# Patient Record
Sex: Male | Born: 1990 | Race: Black or African American | Hispanic: No | Smoking: Never smoker
Health system: Southern US, Community
[De-identification: ages and names within clinical notes are randomized; demographics above are authoritative.]

## PROBLEM LIST (undated history)

## (undated) DIAGNOSIS — E785 Hyperlipidemia, unspecified: Secondary | ICD-10-CM

## (undated) DIAGNOSIS — R7303 Prediabetes: Secondary | ICD-10-CM

## (undated) HISTORY — PX: NO PAST SURGERIES: SHX2092

---

## 2016-07-25 ENCOUNTER — Ambulatory Visit (HOSPITAL_COMMUNITY)
Admission: EM | Admit: 2016-07-25 | Discharge: 2016-07-25 | Disposition: A | Discharge status: Home / Self Care | Payer: BC Managed Care – PPO | Attending: Family Medicine | Admitting: Family Medicine

## 2016-07-25 ENCOUNTER — Encounter (HOSPITAL_COMMUNITY): Payer: Self-pay | Admitting: Emergency Medicine

## 2016-07-25 DIAGNOSIS — J0110 Acute frontal sinusitis, unspecified: Secondary | ICD-10-CM

## 2016-07-25 MED ORDER — DOXYCYCLINE HYCLATE 100 MG PO CAPS: 100.0000 mg | ORAL_CAPSULE | Freq: Two times a day (BID) | ORAL | 0 refills | Status: DC

## 2016-07-25 MED ORDER — IPRATROPIUM BROMIDE 0.06 % NA SOLN: 2.0000 | Freq: Four times a day (QID) | NASAL | 1 refills | Status: DC

## 2016-07-25 MED ORDER — GUAIFENESIN-CODEINE 100-10 MG/5ML PO SYRP: 10.0000 mL | ORAL_SOLUTION | Freq: Four times a day (QID) | ORAL | 0 refills | Status: DC | PRN

## 2016-07-25 NOTE — Discharge Instructions (Signed)
Drink plenty of fluids as discussed, humidifier shower and use medicine as prescribed, and mucinex or delsym for cough. Return or see your doctor if further problems

## 2016-07-25 NOTE — ED Provider Notes (Signed)
[facility]    CSN: [id number] Arrival date & time: 07/25/16  [id number]     History   Chief Complaint Chief Complaint  Patient presents with  . Facial Pain  . Cough    HPI Micheal Gross is a 25 y.o. male.   The history is provided by the patient and a parent.  Cough  Cough characteristics:  Non-productive Severity:  Moderate Onset quality:  Sudden Duration:  1 week Progression:  Unchanged Chronicity:  New Smoker: no   Context: upper respiratory infection and weather changes   Relieved by:  None tried Worsened by:  Nothing Ineffective treatments:  None tried Associated symptoms: ear fullness, rhinorrhea, sinus congestion and sore throat   Associated symptoms: no fever, no shortness of breath and no wheezing     History reviewed. No pertinent past medical history.  There are no active problems to display for this patient.   History reviewed. No pertinent surgical history.     Home Medications    Prior to Admission medications   Not on File    Family History History reviewed. No pertinent family history.  Social History Social History  Substance Use Topics  . Smoking status: Never Smoker  . Smokeless tobacco: Never Used  . Alcohol use Yes     Comment: occasional     Allergies   Patient has no known allergies.   Review of Systems Review of Systems  Constitutional: Negative.  Negative for fever.  HENT: Positive for congestion, postnasal drip, rhinorrhea and sore throat.   Respiratory: Positive for cough. Negative for shortness of breath and wheezing.   Cardiovascular: Negative.   Genitourinary: Negative.   All other systems reviewed and are negative.    Physical Exam Triage Vital Signs ED Triage Vitals [07/25/16 1854]  Enc Vitals Group     BP 149/79     Pulse Rate 105     Resp      Temp 98.8 F (37.1 C)     Temp Source Oral     SpO2 97 %     Weight      Height      Head Circumference      Peak Flow      Pain Score  5     Pain Loc      Pain Edu?      Excl. in GC?    No data found.   Updated Vital Signs BP 149/79 (BP Location: Right Wrist)   Pulse 105   Temp 98.8 F (37.1 C) (Oral)   SpO2 97%   Visual Acuity Right Eye Distance:   Left Eye Distance:   Bilateral Distance:    Right Eye Near:   Left Eye Near:    Bilateral Near:     Physical Exam  Constitutional: He is oriented to person, place, and time. He appears well-developed and well-nourished. No distress.  HENT:  Right Ear: External ear normal.  Left Ear: External ear normal.  Nose: Mucosal edema and rhinorrhea present. Right sinus exhibits frontal sinus tenderness. Left sinus exhibits frontal sinus tenderness.  Mouth/Throat: Oropharynx is clear and moist.  Eyes: Conjunctivae and EOM are normal. Pupils are equal, round, and reactive to light.  Neck: Normal range of motion. Neck supple.  Cardiovascular: Normal rate, regular rhythm, normal heart sounds and intact distal pulses.   Pulmonary/Chest: Effort normal and breath sounds normal.  Lymphadenopathy:    He has no cervical adenopathy.  Neurological: He is alert and oriented to person, place,  and time.  Skin: Skin is warm and dry.  Nursing note and vitals reviewed.    UC Treatments / Results  Labs (all labs ordered are listed, but only abnormal results are displayed) Labs Reviewed - No data to display  EKG  EKG Interpretation None       Radiology No results found.  Procedures Procedures (including critical care time)  Medications Ordered in UC Medications - No data to display   Initial Impression / Assessment and Plan / UC Course  I have reviewed the triage vital signs and the nursing notes.  Pertinent labs & imaging results that were available during my care of the patient were reviewed by me and considered in my medical decision making (see chart for details).  Clinical Course       Final Clinical Impressions(s) / UC Diagnoses   Final diagnoses:    None    New Prescriptions New Prescriptions   No medications on file     Linna HoffJames D Rielyn Krupinski, MD 07/25/16 2107

## 2016-07-25 NOTE — ED Triage Notes (Signed)
Pt has been suffering from congestion, sinus pressure, fatigue and weakness, sore throat and fever for over one week.  He is here today for the persistent cough and facial pain.

## 2016-08-04 ENCOUNTER — Ambulatory Visit (INDEPENDENT_AMBULATORY_CARE_PROVIDER_SITE_OTHER): Payer: BC Managed Care – PPO | Admitting: Physician Assistant

## 2016-08-04 ENCOUNTER — Ambulatory Visit (INDEPENDENT_AMBULATORY_CARE_PROVIDER_SITE_OTHER): Payer: BC Managed Care – PPO

## 2016-08-04 VITALS — BP 116/72 | HR 82 | Temp 98.9°F | Resp 18 | Ht 71.0 in | Wt 303.0 lb

## 2016-08-04 DIAGNOSIS — J069 Acute upper respiratory infection, unspecified: Secondary | ICD-10-CM

## 2016-08-04 DIAGNOSIS — R05 Cough: Secondary | ICD-10-CM

## 2016-08-04 DIAGNOSIS — J189 Pneumonia, unspecified organism: Secondary | ICD-10-CM | POA: Diagnosis not present

## 2016-08-04 DIAGNOSIS — R059 Cough, unspecified: Secondary | ICD-10-CM

## 2016-08-04 DIAGNOSIS — Z131 Encounter for screening for diabetes mellitus: Secondary | ICD-10-CM | POA: Diagnosis not present

## 2016-08-04 LAB — POCT CBC
GRANULOCYTE PERCENT: 76.3 % (ref 37–80)
HEMATOCRIT: 45.8 % (ref 43.5–53.7)
Hemoglobin: 15.8 g/dL (ref 14.1–18.1)
Lymph, poc: 2.4 (ref 0.6–3.4)
MCH, POC: 28.5 pg (ref 27–31.2)
MCHC: 34.5 g/dL (ref 31.8–35.4)
MCV: 82.6 fL (ref 80–97)
MID (CBC): 0.5 (ref 0–0.9)
MPV: 8.5 fL (ref 0–99.8)
PLATELET COUNT, POC: 349 10*3/uL (ref 142–424)
POC GRANULOCYTE: 9.5 — AB (ref 2–6.9)
POC LYMPH %: 19.5 % (ref 10–50)
POC MID %: 4.2 %M (ref 0–12)
RBC: 5.54 M/uL (ref 4.69–6.13)
RDW, POC: 14.5 %
WBC: 12.5 10*3/uL — AB (ref 4.6–10.2)

## 2016-08-04 LAB — POCT GLYCOSYLATED HEMOGLOBIN (HGB A1C): HEMOGLOBIN A1C: 5.7

## 2016-08-04 MED ORDER — AZITHROMYCIN 250 MG PO TABS: ORAL_TABLET | ORAL | 0 refills | Status: DC

## 2016-08-04 NOTE — Patient Instructions (Addendum)
Drink lots of water. Come back in 4-5 days if you are not improving.     IF you received an x-ray today, you will receive an invoice from [facility]. Please contact [facility] at [phone] with questions or concerns regarding your invoice.   IF you received labwork today, you will receive an invoice from [facility]. Please contact [facility] at [phone] with questions or concerns regarding your invoice.   Our billing staff will not be able to assist you with questions regarding bills from these companies.  You will be contacted with the lab results as soon as they are available. The fastest way to get your results is to activate your My Chart account. Instructions are located on the last page of this paperwork. If you have not heard from us regarding the results in 2 weeks, please contact this office.

## 2016-08-04 NOTE — Progress Notes (Addendum)
08/04/2016 1:04 PM   DOB: 20-Sep-1990 / MRN: [id number]  SUBJECTIVE:  Micheal Gross is a 26 y.o. male presenting for HA, fatigue, cough that has been present since the 29th of December.  Was evaluated for a URI on the 29th however was ill from the 20th to now.he denies a history of asthma, and is a never smoker. Has taken taken 7 days of doxycycline and report this did not help his symptoms.. Denies fever, chills, nausea.   He has No Known Allergies.   He  has no past medical history on file.    He  reports that he has never smoked. He has never used smokeless tobacco. He reports that he drinks alcohol. He reports that he does not use drugs. He  has no sexual activity history on file. The patient  has no past surgical history on file.  His family history is not on file.  Review of Systems  Constitutional: Negative for chills and fever.  Genitourinary: Negative for dysuria.  Skin: Negative for itching and rash.    The problem list and medications were reviewed and updated by myself where necessary and exist elsewhere in the encounter.   OBJECTIVE:  BP 116/72 (BP Location: Right Arm, Patient Position: Sitting, Cuff Size: Large)   Pulse 82   Temp 98.9 F (37.2 C) (Oral)   Resp 18   Ht 5\' 11"  (1.803 m)   Wt (!) 303 lb (137.4 kg)   SpO2 98%   BMI 42.26 kg/m   Physical Exam  Constitutional: He is oriented to person, place, and time. He appears well-developed. He does not appear ill.  Eyes: Conjunctivae and EOM are normal. Pupils are equal, round, and reactive to light.  Cardiovascular: Normal rate and regular rhythm.   Pulmonary/Chest: Effort normal and breath sounds normal. No respiratory distress. He has no wheezes. He has no rales. He exhibits no tenderness.  Abdominal: He exhibits no distension.  Musculoskeletal: Normal range of motion.  Neurological: He is alert and oriented to person, place, and time. No cranial nerve deficit. Coordination normal.  Skin: Skin is warm  and dry. He is not diaphoretic.  Psychiatric: He has a normal mood and affect.  Nursing note and vitals reviewed.   Results for orders placed or performed in visit on 08/04/16 (from the past 72 hour(s))  POCT CBC     Status: Abnormal   Collection Time: 08/04/16  1:00 PM  Result Value Ref Range   WBC 12.5 (A) 4.6 - 10.2 K/uL   Lymph, poc 2.4 0.6 - 3.4   POC LYMPH PERCENT 19.5 10 - 50 %L   MID (cbc) 0.5 0 - 0.9   POC MID % 4.2 0 - 12 %M   POC Granulocyte 9.5 (A) 2 - 6.9   Granulocyte percent 76.3 37 - 80 %G   RBC 5.54 4.69 - 6.13 M/uL   Hemoglobin 15.8 14.1 - 18.1 g/dL   HCT, POC 52.8 41.3 - 53.7 %   MCV 82.6 80 - 97 fL   MCH, POC 28.5 27 - 31.2 pg   MCHC 34.5 31.8 - 35.4 g/dL   RDW, POC 24.4 %   Platelet Count, POC 349 142 - 424 K/uL   MPV 8.5 0 - 99.8 fL  POCT glycosylated hemoglobin (Hb A1C)     Status: None   Collection Time: 08/04/16  1:02 PM  Result Value Ref Range   Hemoglobin A1C 5.7     Dg Chest 2 View  Result Date: 08/04/2016  CLINICAL DATA:  Initial evaluation for acute cough of 3 weeks. EXAM: CHEST  2 VIEW COMPARISON:  None available. FINDINGS: Cardiac and mediastinal silhouettes are within normal limits. Lungs are mildly hypoinflated with elevation left hemidiaphragm. No focal infiltrates identified. No pulmonary edema or pleural effusion. No pneumothorax. Visualized soft tissues and osseous structures within normal limits. IMPRESSION: 1. No active cardiopulmonary disease identified. 2. Mild elevation of the left hemidiaphragm. Electronically Signed   By: Rise MuBenjamin  McClintock M.D.   On: 08/04/2016 12:56    ASSESSMENT AND PLAN:  Micheal Gross was seen today for headache, dizziness and fatigue.  Diagnoses and all orders for this visit:  Cough: Rads and chest exam clear.  He has a granulocytosis and given that it has been so long since these symptoms have started I think it is reasonable to try a different abx.  Will start zpack.  -     DG Chest 2 View; Future -      POCT CBC  Protracted URI -     POCT glycosylated hemoglobin (Hb A1C)  Atypical pneumonia: See problem one.   Other orders -     azithromycin (ZITHROMAX) 250 MG tablet; Take two on day one and one daily thereafter.    The patient is advised to call or return to clinic if he does not see an improvement in symptoms, or to seek the care of the closest emergency department if he worsens with the above plan.   Deliah BostonMichael Arturo Sofranko, MHS, PA-C [facility] 08/04/2016 1:04 PM

## 2017-03-16 ENCOUNTER — Ambulatory Visit (INDEPENDENT_AMBULATORY_CARE_PROVIDER_SITE_OTHER): Payer: BC Managed Care – PPO | Admitting: Urgent Care

## 2017-03-16 ENCOUNTER — Encounter: Payer: Self-pay | Admitting: Urgent Care

## 2017-03-16 VITALS — BP 133/72 | HR 70 | Temp 98.3°F | Resp 18 | Ht 71.0 in | Wt 297.8 lb

## 2017-03-16 DIAGNOSIS — R42 Dizziness and giddiness: Secondary | ICD-10-CM

## 2017-03-16 DIAGNOSIS — R7303 Prediabetes: Secondary | ICD-10-CM | POA: Diagnosis not present

## 2017-03-16 DIAGNOSIS — R5383 Other fatigue: Secondary | ICD-10-CM

## 2017-03-16 DIAGNOSIS — R519 Headache, unspecified: Secondary | ICD-10-CM

## 2017-03-16 DIAGNOSIS — R51 Headache: Secondary | ICD-10-CM

## 2017-03-16 LAB — POCT CBC
GRANULOCYTE PERCENT: 68 % (ref 37–80)
HCT, POC: 46.8 % (ref 43.5–53.7)
HEMOGLOBIN: 14.5 g/dL (ref 14.1–18.1)
Lymph, poc: 2.2 (ref 0.6–3.4)
MCH, POC: 25.9 pg — AB (ref 27–31.2)
MCHC: 31.1 g/dL — AB (ref 31.8–35.4)
MCV: 83.4 fL (ref 80–97)
MID (cbc): 0.4 (ref 0–0.9)
MPV: 8.5 fL (ref 0–99.8)
PLATELET COUNT, POC: 327 10*3/uL (ref 142–424)
POC Granulocyte: 5.6 (ref 2–6.9)
POC LYMPH %: 26.9 % (ref 10–50)
POC MID %: 5.1 %M (ref 0–12)
RBC: 5.61 M/uL (ref 4.69–6.13)
RDW, POC: 15.4 %
WBC: 8.3 10*3/uL (ref 4.6–10.2)

## 2017-03-16 LAB — POCT GLYCOSYLATED HEMOGLOBIN (HGB A1C): Hemoglobin A1C: 5.8

## 2017-03-16 NOTE — Progress Notes (Signed)
MRN: [id number] DOB: 23-May-1991  Subjective:   Micheal Gross is a 26 y.o. male presenting for chief complaint of Headache (x2 weeks; states after drinking tea he has felt bad); Fatigue; and Dizziness (as well as car sickness(motion))  Reports 2 week history of intermittent headaches, fatigue, dizziness, polydipsia, feels choking sensation in his throat, sinus pressure, some weight loss. His headaches are generalized, all over, lasts several hours, do not occur daily. Has not tried any medications for relief. Denies fever, confusion, sinus pain, throat pain, cough, chest pain, shob, n/v, abdominal pain, dysuria, hematuria, urinary frequency, bloody stools, dark tarry stools. Drinks once every 3-4 weeks. Denies smoking cigarettes. Does exercise normally except for the past 2 weeks when he has felt fatigued. 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 Eats unhealthily, hydrates well.   Van is not currently taking any medications. Also has No Known Allergies.  Ephrem denies past medical and surgical history. Denies family history of cancer, diabetes, HTN, HL, heart disease, stroke, mental illness.    Objective:   Vitals: BP 133/72   Pulse 70   Temp 98.3 F (36.8 C) (Oral)   Resp 18   Ht 5\' 11"  (1.803 m)   Wt 297 lb 12.8 oz (135.1 kg)   SpO2 96%   BMI 41.53 kg/m   Physical Exam  Constitutional: He is oriented to person, place, and time. He appears well-developed and well-nourished.  HENT:  Mouth/Throat: Oropharynx is clear and moist.  Eyes: No scleral icterus.  Neck: Normal range of motion. Neck supple. No thyromegaly present.  Cardiovascular: Normal rate, regular rhythm and intact distal pulses.  Exam reveals no gallop and no friction rub.   No murmur heard. Pulmonary/Chest: No respiratory distress. He has no wheezes. He has no rales.  Abdominal: Soft. Bowel sounds are normal. He exhibits no distension and no mass. There is no tenderness. There is no guarding.  Neurological: He  is alert and oriented to person, place, and time.  Skin: Skin is warm and dry.  Psychiatric: He has a normal mood and affect.   Results for orders placed or performed in visit on 03/16/17 (from the past 24 hour(s))  POCT CBC     Status: Abnormal   Collection Time: 03/16/17 10:33 AM  Result Value Ref Range   WBC 8.3 4.6 - 10.2 K/uL   Lymph, poc 2.2 0.6 - 3.4   POC LYMPH PERCENT 26.9 10 - 50 %L   MID (cbc) 0.4 0 - 0.9   POC MID % 5.1 0 - 12 %M   POC Granulocyte 5.6 2 - 6.9   Granulocyte percent 68.0 37 - 80 %G   RBC 5.61 4.69 - 6.13 M/uL   Hemoglobin 14.5 14.1 - 18.1 g/dL   HCT, POC 73.5 67.0 - 53.7 %   MCV 83.4 80 - 97 fL   MCH, POC 25.9 (A) 27 - 31.2 pg   MCHC 31.1 (A) 31.8 - 35.4 g/dL   RDW, POC 14.1 %   Platelet Count, POC 327 142 - 424 K/uL   MPV 8.5 0 - 99.8 fL  POCT glycosylated hemoglobin (Hb A1C)     Status: None   Collection Time: 03/16/17 10:37 AM  Result Value Ref Range   Hemoglobin A1C 5.8    Assessment and Plan :   1. Dizziness 2. Other fatigue 3. Nonintractable headache, unspecified chronicity pattern, unspecified headache type - Unclear etiology, physical exam findings reassuring, labs pending. Will f/u with results.  4. Pre-diabetes - Recommended lifestyle modifications.  Wallis Bamberg, PA-C Primary Care at [facility] [phone] 03/16/2017  9:51 AM

## 2017-03-16 NOTE — Patient Instructions (Addendum)
Salads - kale, spinach, cabbage, spring mix; use seeds like pumpkin seeds or sunflower seeds; you can also use 1-2 hard boiled eggs. Fruits - avocadoes, berries (blueberries, raspberries, blackberries), apples, oranges, pomegranate, grapefruit Seeds - quinoa, chia seeds; you can also incorporate oatmeal Vegetables - aspargus, cauliflower, broccoli, green beans, brussel spouts, bell peppers; stay away from starchy vegetables like potatoes, carrots, peas  Brussel sprouts - Cut off stems. Place in a mixing bowl that has a lid. Pour in a 1/4-1/2 cup olive oil, spices, use a light amount of parmesan. Place on a baking sheet. Bake for 10 minutes at 400F. Take it out, eat the brussel chips. Place for another 5-10 minutes.   Mashed cauliflower - Boil a bunch of cauliflower in a pot of water. Blend in a food processor with 1-2 tablespoons of butter.  Spaghetti squash -  Cut the squash in half very carefully, clean out seeds from the middle. Place 1/2 face down in a microwave safe dish with at least 2 inches of water. Make 4-6 slits on outside of spaghetti squash and microwave for 10-12 minutes. Take out the spaghetti using a metal spoon. Repeat for the other half.   Vega protein is good protein powder, make sure you use ~6 ice cubes to give it smoothie consistency together with ~4-6 ounces of vanilla soy milk. Throw cinnamon into your shake, use peanut butter. You can also use the fruits that I listed above. Throw spinach or kale into the shake.     Fatigue Fatigue is feeling tired all of the time, a lack of energy, or a lack of motivation. Occasional or mild fatigue is often a normal response to activity or life in general. However, long-lasting (chronic) or extreme fatigue may indicate an underlying medical condition. Follow these instructions at home: Watch your fatigue for any changes. The following actions may help to lessen any discomfort you are feeling:  Talk to your health care provider about  how much sleep you need each night. Try to get the required amount every night.  Take medicines only as directed by your health care provider.  Eat a healthy and nutritious diet. Ask your health care provider if you need help changing your diet.  Drink enough fluid to keep your urine clear or pale yellow.  Practice ways of relaxing, such as yoga, meditation, massage therapy, or acupuncture.  Exercise regularly.  Change situations that cause you stress. Try to keep your work and personal routine reasonable.  Do not abuse illegal drugs.  Limit alcohol intake to no more than 1 drink per day for nonpregnant women and 2 drinks per day for men. One drink equals 12 ounces of beer, 5 ounces of wine, or 1 ounces of hard liquor.  Take a multivitamin, if directed by your health care provider.  Contact a health care provider if:  Your fatigue does not get better.  You have a fever.  You have unintentional weight loss or gain.  You have headaches.  You have difficulty: ? Falling asleep. ? Sleeping throughout the night.  You feel angry, guilty, anxious, or sad.  You are unable to have a bowel movement (constipation).  You skin is dry.  Your legs or another part of your body is swollen. Get help right away if:  You feel confused.  Your vision is blurry.  You feel faint or pass out.  You have a severe headache.  You have severe abdominal, pelvic, or back pain.  You have chest pain, shortness  of breath, or an irregular or fast heartbeat.  You are unable to urinate or you urinate less than normal.  You develop abnormal bleeding, such as bleeding from the rectum, vagina, nose, lungs, or nipples.  You vomit blood.  You have thoughts about harming yourself or committing suicide.  You are worried that you might harm someone else. This information is not intended to replace advice given to you by your health care provider. Make sure you discuss any questions you have with  your health care provider. Document Released: 05/11/2007 Document Revised: 12/20/2015 Document Reviewed: 11/15/2013 Elsevier Interactive Patient Education  2018 [employer].   IF you received an x-ray today, you will receive an invoice from [facility]. Please contact [facility] at [phone] with questions or concerns regarding your invoice.   IF you received labwork today, you will receive an invoice from [facility]. Please contact [facility] at [phone] with questions or concerns regarding your invoice.   Our billing staff will not be able to assist you with questions regarding bills from these companies.  You will be contacted with the lab results as soon as they are available. The fastest way to get your results is to activate your My Chart account. Instructions are located on the last page of this paperwork. If you have not heard from [place] regarding the results in 2 weeks, please contact this office.

## 2017-03-17 LAB — COMPREHENSIVE METABOLIC PANEL
ALK PHOS: 73 IU/L (ref 39–117)
ALT: 13 IU/L (ref 0–44)
AST: 18 IU/L (ref 0–40)
Albumin/Globulin Ratio: 1.5 (ref 1.2–2.2)
Albumin: 4.4 g/dL (ref 3.5–5.5)
BUN/Creatinine Ratio: 9 (ref 9–20)
BUN: 12 mg/dL (ref 6–20)
Bilirubin Total: 0.6 mg/dL (ref 0.0–1.2)
CO2: 24 mmol/L (ref 20–29)
CREATININE: 1.28 mg/dL — AB (ref 0.76–1.27)
Calcium: 9.9 mg/dL (ref 8.7–10.2)
Chloride: 103 mmol/L (ref 96–106)
GFR calc Af Amer: 88 mL/min/{1.73_m2} (ref >59)
GFR calc non Af Amer: 76 mL/min/{1.73_m2} (ref >59)
GLUCOSE: 96 mg/dL (ref 65–99)
Globulin, Total: 3 g/dL (ref 1.5–4.5)
Potassium: 4.6 mmol/L (ref 3.5–5.2)
Sodium: 142 mmol/L (ref 134–144)
Total Protein: 7.4 g/dL (ref 6.0–8.5)

## 2017-03-17 LAB — TSH: TSH: 0.997 u[IU]/mL (ref 0.450–4.500)

## 2017-09-22 ENCOUNTER — Encounter: Payer: Self-pay | Admitting: Urgent Care

## 2017-09-22 ENCOUNTER — Ambulatory Visit: Payer: BC Managed Care – PPO | Admitting: Urgent Care

## 2017-09-22 VITALS — BP 124/81 | HR 92 | Temp 98.3°F | Resp 18 | Ht 71.0 in | Wt 298.6 lb

## 2017-09-22 DIAGNOSIS — R7303 Prediabetes: Secondary | ICD-10-CM

## 2017-09-22 DIAGNOSIS — R1084 Generalized abdominal pain: Secondary | ICD-10-CM | POA: Diagnosis not present

## 2017-09-22 LAB — POCT CBC
Granulocyte percent: 66.3 %G (ref 37–80)
HEMATOCRIT: 45.4 % (ref 43.5–53.7)
Hemoglobin: 15.4 g/dL (ref 14.1–18.1)
LYMPH, POC: 2.7 (ref 0.6–3.4)
MCH, POC: 28 pg (ref 27–31.2)
MCHC: 33.9 g/dL (ref 31.8–35.4)
MCV: 82.5 fL (ref 80–97)
MID (cbc): 0.4 (ref 0–0.9)
MPV: 8.4 fL (ref 0–99.8)
POC GRANULOCYTE: 6.2 (ref 2–6.9)
POC LYMPH %: 29.1 % (ref 10–50)
POC MID %: 4.6 % (ref 0–12)
Platelet Count, POC: 322 10*3/uL (ref 142–424)
RBC: 5.5 M/uL (ref 4.69–6.13)
RDW, POC: 14.3 %
WBC: 9.3 10*3/uL (ref 4.6–10.2)

## 2017-09-22 LAB — POCT GLYCOSYLATED HEMOGLOBIN (HGB A1C): Hemoglobin A1C: 5.8

## 2017-09-22 MED ORDER — RANITIDINE HCL 150 MG PO TABS: 150.0000 mg | ORAL_TABLET | Freq: Two times a day (BID) | ORAL | 0 refills | Status: DC

## 2017-09-22 MED ORDER — OMEPRAZOLE 20 MG PO CPDR: 20.0000 mg | DELAYED_RELEASE_CAPSULE | Freq: Every day | ORAL | 3 refills | Status: DC

## 2017-09-22 NOTE — Progress Notes (Signed)
  MRN: [id number] DOB: [birth year]  Subjective:   Micheal Gross is a 27 y.o. male presenting for 4 day history of belly pain. Pain is intermittent, burning type sensation, has had difficulty tolerating his normal foods, 2 loose stools today. He is trying to hydrate, drink water. Reports that he has also had thirst. Denies fever, decreased appetite, n/v, diarrhea, bloody stools. Admits unhealthy diet with fatty, greasy or fried foods. Denies smoking cigarettes.  Micheal Gross is not currently taking any medications and has No Known Allergies.  Micheal Gross denies past medical and surgical history. Denies family history of diabetes.   Objective:   Vitals: BP 124/81   Pulse 92   Temp 98.3 F (36.8 C) (Oral)   Resp 18   Ht 5\' 11"  (1.803 m)   Wt 298 lb 9.6 oz (135.4 kg)   SpO2 97%   BMI 41.65 kg/m   Physical Exam  Constitutional: He is oriented to person, place, and time. He appears well-developed and well-nourished.  Cardiovascular: Normal rate, regular rhythm and intact distal pulses. Exam reveals no gallop and no friction rub.  No murmur heard. Pulmonary/Chest: No respiratory distress. He has no wheezes. He has no rales.  Abdominal: Soft. Bowel sounds are normal. He exhibits no distension and no mass. There is generalized tenderness. There is no rebound and no guarding.  Neurological: He is alert and oriented to person, place, and time.  Skin: Skin is warm and dry.  Psychiatric: He has a normal mood and affect.   Results for orders placed or performed in visit on 09/22/17 (from the past 24 hour(s))  POCT CBC     Status: None   Collection Time: 09/22/17  3:26 PM  Result Value Ref Range   WBC 9.3 4.6 - 10.2 K/uL   Lymph, poc 2.7 0.6 - 3.4   POC LYMPH PERCENT 29.1 10 - 50 %L   MID (cbc) 0.4 0 - 0.9   POC MID % 4.6 0 - 12 %M   POC Granulocyte 6.2 2 - 6.9   Granulocyte percent 66.3 37 - 80 %G   RBC 5.50 4.69 - 6.13 M/uL   Hemoglobin 15.4 14.1 - 18.1 g/dL   HCT, POC 40.945.4 81.143.5 -  53.7 %   MCV 82.5 80 - 97 fL   MCH, POC 28.0 27 - 31.2 pg   MCHC 33.9 31.8 - 35.4 g/dL   RDW, POC 91.414.3 %   Platelet Count, POC 322 142 - 424 K/uL   MPV 8.4 0 - 99.8 fL  POCT glycosylated hemoglobin (Hb A1C)     Status: None   Collection Time: 09/22/17  3:33 PM  Result Value Ref Range   Hemoglobin A1C 5.8     Assessment and Plan :   Generalized abdominal pain - Plan: Comprehensive metabolic panel, POCT CBC, H. pylori breath test, POCT glycosylated hemoglobin (Hb A1C)  Morbid obesity (HCC)  Will manage as GERD, labs pending. Recommended dietary modifications. Will discuss treatment plan and follow up once labs result.   Wallis BambergMario Kathlynn Swofford, PA-C [facility] at [facility] [phone] 09/22/2017  3:11 PM

## 2017-09-22 NOTE — Patient Instructions (Addendum)
Start taking Zantac for short term relief of what I suspect is acid reflux. This medication is to be taken twice daily and stops working after about 4-6 weeks. It will be important to use this medication while Prilosec get into your system and helps you more long term. This kind of medication takes about 2-3 weeks to really start working. In the meantime, review the information below regarding GERD. I will follow up with your lab results and discuss treatment plan with you when everything comes back.   Abdominal Pain, Adult Many things can cause belly (abdominal) pain. Most times, belly pain is not dangerous. Many cases of belly pain can be watched and treated at home. Sometimes belly pain is serious, though. Your doctor will try to find the cause of your belly pain. Follow these instructions at home:  Take over-the-counter and prescription medicines only as told by your doctor. Do not take medicines that help you poop (laxatives) unless told to by your doctor.  Drink enough fluid to keep your pee (urine) clear or pale yellow.  Watch your belly pain for any changes.  Keep all follow-up visits as told by your doctor. This is important. Contact a doctor if:  Your belly pain changes or gets worse.  You are not hungry, or you lose weight without trying.  You are having trouble pooping (constipated) or have watery poop (diarrhea) for more than 2-3 days.  You have pain when you pee or poop.  Your belly pain wakes you up at night.  Your pain gets worse with meals, after eating, or with certain foods.  You are throwing up and cannot keep anything down.  You have a fever. Get help right away if:  Your pain does not go away as soon as your doctor says it should.  You cannot stop throwing up.  Your pain is only in areas of your belly, such as the right side or the left lower part of the belly.  You have bloody or black poop, or poop that looks like tar.  You have very bad pain,  cramping, or bloating in your belly.  You have signs of not having enough fluid or water in your body (dehydration), such as: ? Dark pee, very little pee, or no pee. ? Cracked lips. ? Dry mouth. ? Sunken eyes. ? Sleepiness. ? Weakness. This information is not intended to replace advice given to you by your health care provider. Make sure you discuss any questions you have with your health care provider. Document Released: 12/31/2007 Document Revised: 02/01/2016 Document Reviewed: 12/26/2015 Elsevier Interactive Patient Education  2018 [employer]     IF you received an x-ray today, you will receive an invoice from [facility]. Please contact [facility] at [phone] with questions or concerns regarding your invoice.   IF you received labwork today, you will receive an invoice from [facility]. Please contact [facility] at [phone] with questions or concerns regarding your invoice.   Our billing staff will not be able to assist you with questions regarding bills from these companies.  You will be contacted with the lab results as soon as they are available. The fastest way to get your results is to activate your My Chart account. Instructions are located on the last page of this paperwork. If you have not heard from us regarding the results in 2 weeks, please contact this office.

## 2017-09-23 LAB — COMPREHENSIVE METABOLIC PANEL
ALK PHOS: 79 IU/L (ref 39–117)
ALT: 15 IU/L (ref 0–44)
AST: 15 IU/L (ref 0–40)
Albumin/Globulin Ratio: 1.5 (ref 1.2–2.2)
Albumin: 4.8 g/dL (ref 3.5–5.5)
BUN/Creatinine Ratio: 8 — ABNORMAL LOW (ref 9–20)
BUN: 10 mg/dL (ref 6–20)
Bilirubin Total: 0.6 mg/dL (ref 0.0–1.2)
CALCIUM: 10 mg/dL (ref 8.7–10.2)
CO2: 23 mmol/L (ref 20–29)
CREATININE: 1.2 mg/dL (ref 0.76–1.27)
Chloride: 100 mmol/L (ref 96–106)
GFR calc Af Amer: 95 mL/min/{1.73_m2} (ref >59)
GFR, EST NON AFRICAN AMERICAN: 82 mL/min/{1.73_m2} (ref >59)
GLOBULIN, TOTAL: 3.1 g/dL (ref 1.5–4.5)
GLUCOSE: 84 mg/dL (ref 65–99)
Potassium: 4.8 mmol/L (ref 3.5–5.2)
Sodium: 140 mmol/L (ref 134–144)
Total Protein: 7.9 g/dL (ref 6.0–8.5)

## 2017-09-23 LAB — H. PYLORI BREATH TEST: H PYLORI BREATH TEST: NEGATIVE

## 2018-01-21 ENCOUNTER — Ambulatory Visit: Payer: BC Managed Care – PPO | Admitting: Urgent Care

## 2018-01-22 ENCOUNTER — Ambulatory Visit: Payer: BC Managed Care – PPO | Admitting: Urgent Care

## 2018-01-22 ENCOUNTER — Other Ambulatory Visit: Payer: Self-pay

## 2018-01-22 ENCOUNTER — Encounter: Payer: Self-pay | Admitting: Urgent Care

## 2018-01-22 ENCOUNTER — Ambulatory Visit: Payer: BC Managed Care – PPO | Admitting: Physician Assistant

## 2018-01-22 VITALS — BP 130/83 | HR 69 | Temp 98.7°F | Resp 18 | Ht 71.0 in | Wt 294.6 lb

## 2018-01-22 DIAGNOSIS — S76019A Strain of muscle, fascia and tendon of unspecified hip, initial encounter: Secondary | ICD-10-CM

## 2018-01-22 DIAGNOSIS — M62838 Other muscle spasm: Secondary | ICD-10-CM | POA: Diagnosis not present

## 2018-01-22 DIAGNOSIS — M7918 Myalgia, other site: Secondary | ICD-10-CM

## 2018-01-22 MED ORDER — MELOXICAM 15 MG PO TABS: 7.5000 mg | ORAL_TABLET | Freq: Every day | ORAL | 0 refills | Status: DC

## 2018-01-22 MED ORDER — CYCLOBENZAPRINE HCL 5 MG PO TABS: 5.0000 mg | ORAL_TABLET | Freq: Every day | ORAL | 1 refills | Status: DC

## 2018-01-22 NOTE — Patient Instructions (Addendum)
Muscle Cramps and Spasms Muscle cramps and spasms occur when a muscle or muscles tighten and you have no control over this tightening (involuntary muscle contraction). They are a common problem and can develop in any muscle. The most common place is in the calf muscles of the leg. Muscle cramps and muscle spasms are both involuntary muscle contractions, but there are some differences between the two:  Muscle cramps are painful. They come and go and may last a few seconds to 15 minutes. Muscle cramps are often more forceful and last longer than muscle spasms.  Muscle spasms may or may not be painful. They may also last just a few seconds or much longer.  Certain medical conditions, such as diabetes or Parkinson disease, can make it more likely to develop cramps or spasms. However, cramps or spasms are usually not caused by a serious underlying problem. Common causes include:  Overexertion.  Overuse from repetitive motions, or doing the same thing over and over.  Remaining in a certain position for a long period of time.  Improper preparation, form, or technique while playing a sport or doing an activity.  Dehydration.  Injury.  Side effects of some medicines.  Abnormally low levels of the salts and ions in your blood (electrolytes), especially potassium and calcium. This could happen if you are taking water pills (diuretics) or if you are pregnant.  In many cases, the cause of muscle cramps or spasms is unknown. Follow these instructions at home:  Stay well hydrated. Drink enough fluid to keep your urine clear or pale yellow.  Try massaging, stretching, and relaxing the affected muscle.  If directed, apply heat to tight or tense muscles as often as told by your health care provider. Use the heat source that your health care provider recommends, such as a moist heat pack or a heating pad. ? Place a towel between your skin and the heat source. ? Leave the heat on for 20-30  minutes. ? Remove the heat if your skin turns bright red. This is especially important if you are unable to feel pain, heat, or cold. You may have a greater risk of getting burned.  If directed, put ice on the affected area. This may help if you are sore or have pain after a cramp or spasm. ? Put ice in a plastic bag. ? Place a towel between your skin and the bag. ? Leavethe ice on for 20 minutes, 2-3 times a day.  Take over-the-counter and prescription medicines only as told by your health care provider.  Pay attention to any changes in your symptoms. Contact a health care provider if:  Your cramps or spasms get more severe or happen more often.  Your cramps or spasms do not improve over time. This information is not intended to replace advice given to you by your health care provider. Make sure you discuss any questions you have with your health care provider. Document Released: 01/03/2002 Document Revised: 08/15/2015 Document Reviewed: 04/17/2015 Elsevier Interactive Patient Education  2018 Elsevier Inc.     IF you received an x-ray today, you will receive an invoice from [facility]. Please contact [facility] at [phone] with questions or concerns regarding your invoice.   IF you received labwork today, you will receive an invoice from [facility]. Please contact [facility] at [phone] with questions or concerns regarding your invoice.   Our billing staff will not be able to assist you with questions regarding bills from these companies.  You will be contacted   with the lab results as soon as they are available. The fastest way to get your results is to activate your My Chart account. Instructions are located on the last page of this paperwork. If you have not heard from us regarding the results in 2 weeks, please contact this office.      

## 2018-01-22 NOTE — Progress Notes (Signed)
    MRN: [id number] DOB: May 18, 1991  Subjective:   Micheal Gross is a 27 y.o. male presenting for 3-day history of right buttock pain.  Patient reports that the buttock pain is like a burning type sensation and is more on the underside of his buttock closer to his thigh and medially.  Reports that Gross has been exercising for the past 27 out of 30 days.  Gross is doing a combination of weightlifting including squats, dead lifts and running on the treadmill, using start climber.  Gross cannot call any inciting event.  Gross has tried ibuprofen with some relief.  Since Gross started having pain Gross has not been exercising.  Denies history of sciatica, piriformis syndrome.  Denies dysuria, hematuria, weakness, constipation, bloody stools, testicular pain, penile pain, low back pain.  Micheal Gross Gross is not currently taking any medications and has no known food or drug allergies.  Denies past medical and surgical history.  Objective:   Vitals: BP 130/83   Pulse 69   Temp 98.7 F (37.1 C) (Oral)   Resp 18   Ht 5\' 11"  (1.803 m)   Wt 294 lb 9.6 oz (133.6 kg)   SpO2 96%   BMI 41.09 kg/m   Physical Exam  Constitutional: Gross is oriented to person, place, and time. Gross appears well-developed and well-nourished.  Cardiovascular: Normal rate.  Pulmonary/Chest: Effort normal.  Musculoskeletal:       Right hip: Gross exhibits tenderness (over area depicted). Gross exhibits normal range of motion, normal strength, no bony tenderness, no swelling, no crepitus, no deformity and no laceration.       Lumbar back: Gross exhibits normal range of motion, no tenderness, no bony tenderness, no swelling, no edema, no deformity, no laceration, no pain, no spasm and normal pulse.       Legs: Positive straight leg raise to the right.  Neurological: Gross is alert and oriented to person, place, and time.   Assessment and Plan :   Strain of buttock, initial encounter  Buttock pain  Muscle spasm  Will start conservative  management for his symptoms including rest, meloxicam, Flexeril and consistent hydration.  Reviewed stretching exercises for low back and buttock.  Patient is to follow-up in 1 to 2 weeks if his symptoms persist.  Micheal BambergMario Coleen Cardiff, PA-C Primary Care at [facility] [phone] 01/22/2018  1:48 PM

## 2018-01-26 ENCOUNTER — Encounter: Payer: Self-pay | Admitting: Urgent Care

## 2018-01-27 ENCOUNTER — Encounter: Payer: Self-pay | Admitting: Physician Assistant

## 2018-01-27 ENCOUNTER — Ambulatory Visit: Payer: BC Managed Care – PPO | Admitting: Urgent Care

## 2018-01-27 ENCOUNTER — Other Ambulatory Visit: Payer: Self-pay

## 2018-01-27 ENCOUNTER — Telehealth: Payer: Self-pay | Admitting: Physician Assistant

## 2018-01-27 ENCOUNTER — Ambulatory Visit: Payer: BC Managed Care – PPO | Admitting: Physician Assistant

## 2018-01-27 VITALS — BP 110/74 | HR 87 | Temp 98.0°F | Resp 16 | Ht 72.0 in | Wt 293.0 lb

## 2018-01-27 DIAGNOSIS — K649 Unspecified hemorrhoids: Secondary | ICD-10-CM

## 2018-01-27 MED ORDER — HYDROCORTISONE 2.5 % RE CREA: 1.0000 "application " | TOPICAL_CREAM | Freq: Two times a day (BID) | RECTAL | 0 refills | Status: DC

## 2018-01-27 MED ORDER — HYDROCORTISONE ACETATE 25 MG RE SUPP: 25.0000 mg | Freq: Two times a day (BID) | RECTAL | 0 refills | Status: DC

## 2018-01-27 NOTE — Telephone Encounter (Signed)
Pt is asking for prescription medication for pain - he just left office and "forgot to ask".  States "The llidocaine is wearing off."  If appropriate:  [id number] - [place], [place] - [address]    Phone: [phone] Fax: [phone]

## 2018-01-27 NOTE — Patient Instructions (Addendum)
Do sitz baths 1-2 times a day for the next week.  This will help facilitate drainage.  You can use topical steroid and/or suppositories for this, but long-term use should be avoided because of potential thinning of perianal and anal mucosa and increasing risk of injury.  Be mindful of your weight lifting techniques.   Hemorrhoids Hemorrhoids are swollen veins in and around the rectum or anus. There are two types of hemorrhoids:  Internal hemorrhoids. These occur in the veins that are just inside the rectum. They may poke through to the outside and become irritated and painful.  External hemorrhoids. These occur in the veins that are outside of the anus and can be felt as a painful swelling or hard lump near the anus.  Most hemorrhoids do not cause serious problems, and they can be managed with home treatments such as diet and lifestyle changes. If home treatments do not help your symptoms, procedures can be done to shrink or remove the hemorrhoids. What are the causes? This condition is caused by increased pressure in the anal area. This pressure may result from various things, including:  Constipation.  Straining to have a bowel movement.  Diarrhea.  Pregnancy.  Obesity.  Sitting for long periods of time.  Heavy lifting or other activity that causes you to strain.  Anal sex.  What are the signs or symptoms? Symptoms of this condition include:  Pain.  Anal itching or irritation.  Rectal bleeding.  Leakage of stool (feces).  Anal swelling.  One or more lumps around the anus.  How is this diagnosed? This condition can often be diagnosed through a visual exam. Other exams or tests may also be done, such as:  Examination of the rectal area with a gloved hand (digital rectal exam).  Examination of the anal canal using a small tube (anoscope).  A blood test, if you have lost a significant amount of blood.  A test to look inside the colon (sigmoidoscopy or  colonoscopy).  How is this treated? This condition can usually be treated at home. However, various procedures may be done if dietary changes, lifestyle changes, and other home treatments do not help your symptoms. These procedures can help make the hemorrhoids smaller or remove them completely. Some of these procedures involve surgery, and others do not. Common procedures include:  Rubber band ligation. Rubber bands are placed at the base of the hemorrhoids to cut off the blood supply to them.  Sclerotherapy. Medicine is injected into the hemorrhoids to shrink them.  Infrared coagulation. A type of light energy is used to get rid of the hemorrhoids.  Hemorrhoidectomy surgery. The hemorrhoids are surgically removed, and the veins that supply them are tied off.  Stapled hemorrhoidopexy surgery. A circular stapling device is used to remove the hemorrhoids and use staples to cut off the blood supply to them.  Follow these instructions at home: Eating and drinking  Eat foods that have a lot of fiber in them, such as whole grains, beans, nuts, fruits, and vegetables. Ask your health care provider about taking products that have added fiber (fiber supplements).  Drink enough fluid to keep your urine clear or pale yellow. Managing pain and swelling  Take warm sitz baths for 20 minutes, 3-4 times a day to ease pain and discomfort.  If directed, apply ice to the affected area. Using ice packs between sitz baths may be helpful. ? Put ice in a plastic bag. ? Place a towel between your skin and the  bag. ? Leave the ice on for 20 minutes, 2-3 times a day. General instructions  Take over-the-counter and prescription medicines only as told by your health care provider.  Use medicated creams or suppositories as told.  Exercise regularly.  Go to the bathroom when you have the urge to have a bowel movement. Do not wait.  Avoid straining to have bowel movements.  Keep the anal area dry and  clean. Use wet toilet paper or moist towelettes after a bowel movement.  Do not sit on the toilet for long periods of time. This increases blood pooling and pain. Contact a health care provider if:  You have increasing pain and swelling that are not controlled by treatment or medicine.  You have uncontrolled bleeding.  You have difficulty having a bowel movement, or you are unable to have a bowel movement.  You have pain or inflammation outside the area of the hemorrhoids. This information is not intended to replace advice given to you by your health care provider. Make sure you discuss any questions you have with your health care provider. Document Released: 07/11/2000 Document Revised: 12/12/2015 Document Reviewed: 03/28/2015 Elsevier Interactive Patient Education  2018 [employer].  IF you received an x-ray today, you will receive an invoice from [facility]. Please contact [facility] at [phone] with questions or concerns regarding your invoice.   IF you received labwork today, you will receive an invoice from [facility]. Please contact [facility] at [phone] with questions or concerns regarding your invoice.   Our billing staff will not be able to assist you with questions regarding bills from these companies.  You will be contacted with the lab results as soon as they are available. The fastest way to get your results is to activate your My Chart account. Instructions are located on the last page of this paperwork. If you have not heard from [place] regarding the results in 2 weeks, please contact this office.

## 2018-01-27 NOTE — Telephone Encounter (Signed)
Copied from CRM [id number]. Topic: Quick Communication - Rx Refill/Question >> Jan 27, 2018 12:11 PM Cipriano BunkerLambe, Annette S wrote: Medication:   Pt is asking if can get prescription for pain - he just left office and forgot to ask.  The lidocaine is wearing off.  Has the patient contacted their pharmacy? No. (Agent: If no, request that the patient contact the pharmacy for the refill.) (Agent: If yes, when and what did the pharmacy advise?)  Preferred Pharmacy (with phone number or street name):  [id number] - [place], [place] - [address] [facility] [id number] [place] [id number] Phone: [phone] Fax: [phone]    Agent: Please be advised that RX refills may take up to 3 business days. We ask that you follow-up with your pharmacy.

## 2018-01-27 NOTE — Progress Notes (Signed)
   Micheal FabryChristopher Gross  MRN: [id number] DOB: 14-Mar-1991  PCP: Patient, No Pcp Per  Subjective:  Pt is a 27 year old male who presents to clinic for hemorrhoid.  Patient has had a long history of hemorrhoids, dealing with them for several years. Pt has been treating with Witch Hazel, Peroxide, warm soaks was helping.  He has never had an I&D done. He has been dealing with his most recent flareup for the past 4 days.  It was "bad" 4 days ago then pain improved.  However pain returned yesterday and he endorses bright red blood in the toilet bowl yesterday.  Has been treating with sitz bath's with no improvement.  Pain is worse with sitting. He does a lot of weight lifting.  No straining with bowel movements.   Review of Systems  Constitutional: Negative for chills and fever.  Gastrointestinal: Positive for anal bleeding and rectal pain. Negative for blood in stool, constipation and diarrhea.    Patient Active Problem List   Diagnosis Date Noted  . Generalized abdominal pain 09/22/2017    Current Outpatient Medications on File Prior to Visit  Medication Sig Dispense Refill  . cyclobenzaprine (FLEXERIL) 5 MG tablet Take 1 tablet (5 mg total) by mouth at bedtime. 90 tablet 1  . meloxicam (MOBIC) 15 MG tablet Take 0.5-1 tablets (7.5-15 mg total) by mouth daily. 30 tablet 0   No current facility-administered medications on file prior to visit.     No Known Allergies   Objective:  BP 110/74 (BP Location: Left Arm, Patient Position: Sitting, Cuff Size: Large)   Pulse 87   Temp 98 F (36.7 C) (Oral)   Resp 16   Ht 6' (1.829 m)   Wt 293 lb (132.9 kg)   SpO2 97%   BMI 39.74 kg/m   Physical Exam  Constitutional: He is oriented to person, place, and time. He appears well-developed and well-nourished.  Genitourinary: Rectal exam shows external hemorrhoid (thrombosed ).  Neurological: He is alert and oriented to person, place, and time.  Skin: Skin is warm and dry.  Psychiatric: He  has a normal mood and affect. His behavior is normal. Judgment and thought content normal.  Vitals reviewed.  Procedure: Verbal consent obtained. Skin was anesthetized with lidocaine and epinephrine. Wound was open. A moderate amount of thrombosed material expressed. ound was not packed.. Wound care discussed.  Assessment and Plan :  1. Hemorrhoids, unspecified hemorrhoid type -Patient presents for thrombosed hemorrhoid.  Small to moderate amount of thrombosed clots removed from hemorrhoid.  Patient expresses 80% relief of his pain.  Sitz bath's advised.  Discussed with patient this is more than likely caused by his weight lifting.  Precautions discussed.  He can use Anusol in the future if needed.  Return to clinic if symptoms return - hydrocortisone (ANUSOL-HC) 25 MG suppository; Place 1 suppository (25 mg total) rectally 2 (two) times daily.  Dispense: 12 suppository; Refill: 0 - hydrocortisone (ANUSOL-HC) 2.5 % rectal cream; Place 1 application rectally 2 (two) times daily.  Dispense: 30 g; Refill: 0    Whitney Loras Grieshop, PA-C  Primary Care at [facility] 01/27/2018 10:28 AM

## 2018-01-30 ENCOUNTER — Other Ambulatory Visit: Payer: Self-pay | Admitting: Physician Assistant

## 2018-01-30 DIAGNOSIS — G8918 Other acute postprocedural pain: Secondary | ICD-10-CM

## 2018-01-30 MED ORDER — TRAMADOL HCL 50 MG PO TABS: 50.0000 mg | ORAL_TABLET | Freq: Three times a day (TID) | ORAL | 0 refills | Status: DC | PRN

## 2018-01-30 NOTE — Telephone Encounter (Signed)
Tramadol Rx sent 

## 2018-02-16 ENCOUNTER — Other Ambulatory Visit: Payer: Self-pay | Admitting: Urgent Care

## 2018-04-19 ENCOUNTER — Encounter: Payer: Self-pay | Admitting: Urgent Care

## 2018-04-21 ENCOUNTER — Ambulatory Visit: Payer: BC Managed Care – PPO | Admitting: Family Medicine

## 2018-04-28 ENCOUNTER — Other Ambulatory Visit: Payer: Self-pay

## 2018-04-28 ENCOUNTER — Ambulatory Visit: Payer: BC Managed Care – PPO | Admitting: Family Medicine

## 2018-04-28 ENCOUNTER — Encounter: Payer: Self-pay | Admitting: Family Medicine

## 2018-04-28 VITALS — BP 122/81 | HR 76 | Temp 98.5°F | Resp 16 | Ht 72.0 in | Wt 296.0 lb

## 2018-04-28 DIAGNOSIS — R058 Other specified cough: Secondary | ICD-10-CM

## 2018-04-28 DIAGNOSIS — H6121 Impacted cerumen, right ear: Secondary | ICD-10-CM | POA: Diagnosis not present

## 2018-04-28 DIAGNOSIS — R05 Cough: Secondary | ICD-10-CM | POA: Diagnosis not present

## 2018-04-28 MED ORDER — BENZONATATE 100 MG PO CAPS: 100.0000 mg | ORAL_CAPSULE | Freq: Three times a day (TID) | ORAL | 0 refills | Status: DC | PRN

## 2018-04-28 MED ORDER — AZITHROMYCIN 250 MG PO TABS: ORAL_TABLET | ORAL | 0 refills | Status: DC

## 2018-04-28 MED ORDER — PREDNISONE 20 MG PO TABS: ORAL_TABLET | ORAL | 0 refills | Status: DC

## 2018-04-28 NOTE — Progress Notes (Signed)
Patient ID: Micheal Gross, male    DOB: 12-Feb-1991  Age: 27 y.o. MRN: [id number]  Chief Complaint  Patient presents with  . Cough    chronic cough x 1 month with some chest congestion     Subjective:   27 year old man with a history of a cough for 1 month.  It started as a cold infection with congestion sore throat that cleared quickly, but the cough is continued to persist.  His girlfriend now has a mild case.  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  He does not do a lot of regular exercise.  Generally is healthy.  Realizes his weight is a problem.  Current allergies, medications, problem list, past/family and social histories reviewed.  Objective:  BP 122/81   Pulse 76   Temp 98.5 F (36.9 C) (Oral)   Resp 16   Ht 6' (1.829 m)   Wt 296 lb (134.3 kg)   SpO2 98%   BMI 40.14 kg/m   No major acute distress.  Overweight male.  TMs occluded with cerumen on the right.  The left is normal.  Throat clear.  Neck supple without nodes.  Chest is clear to auscultation except for mildly prolonged expiratory phase on forced expiration.  Heart regular without murmur.  Assessment & Plan:   Assessment: 1. Post-viral cough syndrome   2. Impacted cerumen of right ear       Plan: See instructions  No orders of the defined types were placed in this encounter.   Meds ordered this encounter  Medications  . benzonatate (TESSALON) 100 MG capsule    Sig: Take 1-2 capsules (100-200 mg total) by mouth 3 (three) times daily as needed for cough.    Dispense:  40 capsule    Refill:  0  . predniSONE (DELTASONE) 20 MG tablet    Sig: Take 3 pills each morning for 2 days, then 2 for 2 days, then 1 for 2 days for inflammation in lungs and sinuses.    Dispense:  12 tablet    Refill:  0  . azithromycin (ZITHROMAX) 250 MG tablet    Sig: Take 2 tabs PO x 1 dose, then 1 tab PO QD x 4 days    Dispense:  6 tablet    Refill:  0         Patient  Instructions   Drink lots of fluids including water and juice primarily.  Take the prednisone 20 mg 3 pills each morning for 2 days, then 2 for 2 days, then 1 for 2 days after breakfast.  Use the benzonatate cough pills 1 or 2 pills 3 times daily as needed for daytime cough  Take a azithromycin 250 mg 2 pills initially, then 1 daily for 4 days for possible residual infection  You can continue using NyQuil or Robitussin-DM in addition to that if necessary for nighttime cough.  Return if not improving.  I do not think you need an x-ray today but if problems continue to persist you would need further work-up.  Your right ear is full of wax.  I recommend getting some over-the-counter Debrox and follow the directions on it.  Use it several times until you get a little plug of wax out of there.  Return if it gives you problems.  If you have lab work done today you will be contacted with your lab results within the next 2 weeks.  If you have not heard  from [place] then please contact us. The fastest way to get your results is to register for My Chart.   IF you received an x-ray today, you will receive an invoice from [facility]. Please contact [facility] at [phone] with questions or concerns regarding your invoice.   IF you received labwork today, you will receive an invoice from [facility]. Please contact [facility] at [phone] with questions or concerns regarding your invoice.   Our billing staff will not be able to assist you with questions regarding bills from these companies.  You will be contacted with the lab results as soon as they are available. The fastest way to get your results is to activate your My Chart account. Instructions are located on the last page of this paperwork. If you have not heard from [place] regarding the results in 2 weeks, please contact this office.         Return if symptoms worsen or fail to improve.   Janace Hoard, MD 04/28/2018

## 2018-04-28 NOTE — Patient Instructions (Addendum)
Drink lots of fluids including water and juice primarily.  Take the prednisone 20 mg 3 pills each morning for 2 days, then 2 for 2 days, then 1 for 2 days after breakfast.  Use the benzonatate cough pills 1 or 2 pills 3 times daily as needed for daytime cough  Take a azithromycin 250 mg 2 pills initially, then 1 daily for 4 days for possible residual infection  You can continue using NyQuil or Robitussin-DM in addition to that if necessary for nighttime cough.  Return if not improving.  I do not think you need an x-ray today but if problems continue to persist you would need further work-up.  Your right ear is full of wax.  I recommend getting some over-the-counter Debrox and follow the directions on it.  Use it several times until you get a little plug of wax out of there.  Return if it gives you problems.  If you have lab work done today you will be contacted with your lab results within the next 2 weeks.  If you have not heard from [place] then please contact us. The fastest way to get your results is to register for My Chart.   IF you received an x-ray today, you will receive an invoice from [facility]. Please contact [facility] at [phone] with questions or concerns regarding your invoice.   IF you received labwork today, you will receive an invoice from [facility]. Please contact [facility] at [phone] with questions or concerns regarding your invoice.   Our billing staff will not be able to assist you with questions regarding bills from these companies.  You will be contacted with the lab results as soon as they are available. The fastest way to get your results is to activate your My Chart account. Instructions are located on the last page of this paperwork. If you have not heard from [place] regarding the results in 2 weeks, please contact this office.

## 2018-05-03 ENCOUNTER — Other Ambulatory Visit: Payer: Self-pay

## 2018-05-03 ENCOUNTER — Emergency Department (HOSPITAL_COMMUNITY)
Admission: EM | Admit: 2018-05-03 | Discharge: 2018-05-03 | Disposition: A | Discharge status: Home / Self Care | Payer: BC Managed Care – PPO | Attending: Emergency Medicine | Admitting: Emergency Medicine

## 2018-05-03 ENCOUNTER — Encounter (HOSPITAL_COMMUNITY): Payer: Self-pay | Admitting: Emergency Medicine

## 2018-05-03 ENCOUNTER — Emergency Department (HOSPITAL_COMMUNITY): Payer: BC Managed Care – PPO

## 2018-05-03 DIAGNOSIS — J9801 Acute bronchospasm: Secondary | ICD-10-CM | POA: Diagnosis not present

## 2018-05-03 DIAGNOSIS — J029 Acute pharyngitis, unspecified: Secondary | ICD-10-CM | POA: Diagnosis present

## 2018-05-03 LAB — GROUP A STREP BY PCR: Group A Strep by PCR: NOT DETECTED

## 2018-05-03 MED ORDER — PREDNISONE 50 MG PO TABS: 50.0000 mg | ORAL_TABLET | Freq: Every day | ORAL | 0 refills | Status: DC

## 2018-05-03 MED ORDER — PREDNISONE 20 MG PO TABS
60.0000 mg | ORAL_TABLET | Freq: Once | ORAL | Status: AC
  Administered 2018-05-03: 60 mg via ORAL
  Filled 2018-05-03: qty 3

## 2018-05-03 MED ORDER — ALBUTEROL SULFATE HFA 108 (90 BASE) MCG/ACT IN AERS: 2.0000 | INHALATION_SPRAY | RESPIRATORY_TRACT | 0 refills | Status: DC | PRN

## 2018-05-03 MED ORDER — IPRATROPIUM-ALBUTEROL 0.5-2.5 (3) MG/3ML IN SOLN
3.0000 mL | Freq: Once | RESPIRATORY_TRACT | Status: AC
  Administered 2018-05-03: 3 mL via RESPIRATORY_TRACT
  Filled 2018-05-03: qty 3

## 2018-05-03 NOTE — ED Provider Notes (Signed)
[facility] EMERGENCY DEPARTMENT Provider Note   CSN: [id number] Arrival date & time: 05/03/18  0034     History   Chief Complaint Chief Complaint  Patient presents with  . Sore Throat    HPI Micheal Gross is a 27 y.o. male.  The history is provided by the patient.  Sore Throat   He complains of a cough for the last 6 weeks.  Cough is productive of small to moderate amount of greenish sputum.  There have been episodes of posttussive near emesis.  He denies fever, chills, sweats.  He denies arthralgias or myalgias.  He has used multiple different over-the-counter cough medications without relief.  He was seen at urgent care and given a course of a prednisone taper, azithromycin, benzonatate with no improvement whatsoever.  Tonight, he noted that he was having difficulty breathing.  He has had no sore throat along with this.  His spouse has also had similar symptoms.  He is a non-smoker and denies secondhand smoke exposure.  History reviewed. No pertinent past medical history.  Patient Active Problem List   Diagnosis Date Noted  . Generalized abdominal pain 09/22/2017    History reviewed. No pertinent surgical history.      Home Medications    Prior to Admission medications   Medication Sig Start Date End Date Taking? Authorizing Provider  azithromycin (ZITHROMAX) 250 MG tablet Take 2 tabs PO x 1 dose, then 1 tab PO QD x 4 days 04/28/18   Peyton Najjar, MD  benzonatate (TESSALON) 100 MG capsule Take 1-2 capsules (100-200 mg total) by mouth 3 (three) times daily as needed for cough. 04/28/18   Peyton Najjar, MD  predniSONE (DELTASONE) 20 MG tablet Take 3 pills each morning for 2 days, then 2 for 2 days, then 1 for 2 days for inflammation in lungs and sinuses. 04/28/18   Peyton Najjar, MD  traMADol (ULTRAM) 50 MG tablet Take 1 tablet (50 mg total) by mouth every 8 (eight) hours as needed. 01/30/18   McVey, Madelaine Bhat, PA-C    Family  History No family history on file.  Social History Social History   Tobacco Use  . Smoking status: Never Smoker  . Smokeless tobacco: Never Used  Substance Use Topics  . Alcohol use: Yes    Comment: occasional  . Drug use: No     Allergies   Patient has no known allergies.   Review of Systems Review of Systems  All other systems reviewed and are negative.    Physical Exam Updated Vital Signs BP 134/82   Pulse 85   Temp 98.5 F (36.9 C) (Oral)   Resp 18   SpO2 98%   Physical Exam  Nursing note and vitals reviewed.  27 year old male, resting comfortably and in no acute distress. Vital signs are normal. Oxygen saturation is 98%, which is normal. Head is normocephalic and atraumatic. PERRLA, EOMI. Oropharynx is clear. Neck is nontender and supple without adenopathy or JVD. Back is nontender and there is no CVA tenderness. Lungs have a slightly prolonged exhalation phase with slight wheezing noted with forced exhalation.  There are no rales or rhonchi. Chest is nontender. Heart has regular rate and rhythm without murmur. Abdomen is soft, flat, nontender without masses or hepatosplenomegaly and peristalsis is normoactive. Extremities have no cyanosis or edema, full range of motion is present. Skin is warm and dry without rash. Neurologic: Mental status is normal, cranial nerves are intact, there are no  motor or sensory deficits.  ED Treatments / Results  Labs (all labs ordered are listed, but only abnormal results are displayed) Labs Reviewed  GROUP A STREP BY PCR   Radiology Dg Chest 2 View  Result Date: 05/03/2018 CLINICAL DATA:  Cough for 1 month EXAM: CHEST - 2 VIEW COMPARISON:  08/04/2016 FINDINGS: Normal heart size and mediastinal contours. No acute infiltrate or edema. No effusion or pneumothorax. No acute osseous findings. IMPRESSION: Negative chest. Electronically Signed   By: Marnee Spring M.D.   On: 05/03/2018 02:24    Procedures Procedures    Medications Ordered in ED Medications  predniSONE (DELTASONE) tablet 60 mg (has no administration in time range)  ipratropium-albuterol (DUONEB) 0.5-2.5 (3) MG/3ML nebulizer solution 3 mL (3 mLs Nebulization Given 05/03/18 0204)     Initial Impression / Assessment and Plan / ED Course  I have reviewed the triage vital signs and the nursing notes.  Pertinent labs & imaging results that were available during my care of the patient were reviewed by me and considered in my medical decision making (see chart for details).  Cough which has been persistent for 6 weeks.  All records are reviewed confirming urgent care visit on October 2, treated with course of azithromycin, benzonatate, prednisone taper.  We will try nebulizer treatment with albuterol and ipratropium, will check chest x-ray.  Test x-ray shows no evidence of pneumonia.  He got significant improvement with nebulizer treatment.  He is given a dose of prednisone and is discharged with a prescription for albuterol inhaler and prednisone.  Return precautions discussed.  Final Clinical Impressions(s) / ED Diagnoses   Final diagnoses:  Cough due to bronchospasm    ED Discharge Orders         Ordered    predniSONE (DELTASONE) 50 MG tablet  Daily     05/03/18 0330    albuterol (PROVENTIL HFA;VENTOLIN HFA) 108 (90 Base) MCG/ACT inhaler  Every 4 hours PRN     05/03/18 0330           Dione Booze, MD 05/03/18 [phone]

## 2018-05-03 NOTE — ED Triage Notes (Signed)
Pt reports sore throat, nasal congestion, cough X1 mo. Pt has been seen for same but states he has not seen any improvement.

## 2018-05-18 ENCOUNTER — Ambulatory Visit: Payer: Self-pay

## 2018-05-18 NOTE — Telephone Encounter (Signed)
Patient called, left VM to return call back to the office to offer other options for appointments.

## 2018-05-18 NOTE — Telephone Encounter (Signed)
Patient called in with c/o "cough." He says "the cough has been going on for a few months. I was in the office to see Dr. Alwyn Ren and was put on Prednisone. Was seen in the ED and was given Prednisone, which I didn't take because I didn't like how it made me feel before. I was given an inhaler which I used. I just don't know what is going on and why I'm still having SOB. I want to get a chest x-ray or something." I asked about other symptoms, he says "slight chest pain, dizziness when I do physical work, but I just work through it; weakness all over." According to protocol, see PCP within 3 days, no PCP on file, patient has not established with another provider and would like to stay a [place]. I advised the patient there are no availabilities with providers at the practice in the time frame recommended. I advised I will send this request to the office and someone from the office will call with an appointment after speaking with a provider to get approval for a time, he verbalized understanding. I called an spoke to [place], [employer] about the appointment situation, she says the patient will have to be scheduled at the first available office visit appointment. I called the patient back and offered him Tuesday, 06/01/18 as the first available day with Dr. Alvy Bimler. The patient says "that will not work. I will have to figure something else out." I advised to go back to the ED if his symptoms worsen, he verbalized understanding.   Reason for Disposition . Cough has been present for > 3 weeks  Answer Assessment - Initial Assessment Questions 1. ONSET: "When did the cough begin?"      A few months ago 2. SEVERITY: "How bad is the cough today?"      Not too bad 3. RESPIRATORY DISTRESS: "Describe your breathing."      Slight SOB 4. FEVER: "Do you have a fever?" If so, ask: "What is your temperature, how was it measured, and when did it start?"     No 5. HEMOPTYSIS: "Are you coughing up any blood?" If so  ask: "How much?" (flecks, streaks, tablespoons, etc.)     No 6. TREATMENT: "What have you done so far to treat the cough?" (e.g., meds, fluids, humidifier)     Prednisone, inhaler, fluids, OTC cough medications 7. CARDIAC HISTORY: "Do you have any history of heart disease?" (e.g., heart attack, congestive heart failure)      No 8. LUNG HISTORY: "Do you have any history of lung disease?"  (e.g., pulmonary embolus, asthma, emphysema)     No 9. PE RISK FACTORS: "Do you have a history of blood clots?" (or: recent major surgery, recent prolonged travel, bedridden)     No 10. OTHER SYMPTOMS: "Do you have any other symptoms? (e.g., runny nose, wheezing, chest pain)       Slight chest pain, dizziness when doing physical work, generalized weakness 11. PREGNANCY: "Is there any chance you are pregnant?" "When was your last menstrual period?"       N/A 12. TRAVEL: "Have you traveled out of the country in the last month?" (e.g., travel history, exposures)       No  Protocols used: COUGH - ACUTE NON-PRODUCTIVE-A-AH

## 2018-05-19 ENCOUNTER — Ambulatory Visit: Payer: BC Managed Care – PPO | Admitting: Family Medicine

## 2018-05-19 ENCOUNTER — Encounter: Payer: Self-pay | Admitting: Family Medicine

## 2018-05-19 VITALS — BP 100/70 | HR 96 | Temp 97.6°F | Ht 72.0 in | Wt 298.0 lb

## 2018-05-19 DIAGNOSIS — R05 Cough: Secondary | ICD-10-CM

## 2018-05-19 DIAGNOSIS — R0789 Other chest pain: Secondary | ICD-10-CM

## 2018-05-19 DIAGNOSIS — R059 Cough, unspecified: Secondary | ICD-10-CM | POA: Insufficient documentation

## 2018-05-19 MED ORDER — PANTOPRAZOLE SODIUM 40 MG PO TBEC: 40.0000 mg | DELAYED_RELEASE_TABLET | Freq: Every day | ORAL | 3 refills | Status: DC

## 2018-05-19 NOTE — Progress Notes (Signed)
Micheal Gross - 28 y.o. male MRN [id number]  Date of birth: [birth year]  Subjective Chief Complaint  Patient presents with  . Cough    HPI Micheal Gross is a 27 y.o. male here today with complaint of cough.  He reports that cough began about 8-10 weeks ago.  He initially had URI symptoms which resolved however he continued to have cough.  Cough comes and go but typically has symptoms daily.  He has been seen x2 for this.  Was treated with azithromycin and steroid initially and had mild improvement.  He was seen in ER as well and had negative chest xray.  He does tell me that he feels shortness of breath at times when he is coughing. Symptoms worsened when around fragrances/perfumes.  He does have intermittent chest pain.  Chest pain does not radiate, not associated with activity and is non-pleuritic in nature.  He has no history of asthma, he is a non-smoker, he denies fever, or chills.  He denies congestion or post-nasal drip.  He does have reflux symptoms and has had issues with GERD in the past.  Previous labs were negative for H. Pylori.   ROS:  A comprehensive ROS was completed and negative except as noted per HPI  No Known Allergies  History reviewed. No pertinent past medical history.  History reviewed. No pertinent surgical history.  Social History   Socioeconomic History  . Marital status: Single    Spouse name: Not on file  . Number of children: Not on file  . Years of education: Not on file  . Highest education level: Not on file  Occupational History  . Not on file  Social Needs  . Financial resource strain: Not on file  . Food insecurity:    Worry: Not on file    Inability: Not on file  . Transportation needs:    Medical: Not on file    Non-medical: Not on file  Tobacco Use  . Smoking status: Never Smoker  . Smokeless tobacco: Never Used  Substance and Sexual Activity  . Alcohol use: Yes    Comment: occasional  . Drug use: No  . Sexual activity:  Not on file  Lifestyle  . Physical activity:    Days per week: Not on file    Minutes per session: Not on file  . Stress: Not on file  Relationships  . Social connections:    Talks on phone: Not on file    Gets together: Not on file    Attends religious service: Not on file    Active member of club or organization: Not on file    Attends meetings of clubs or organizations: Not on file    Relationship status: Not on file  Other Topics Concern  . Not on file  Social History Narrative  . Not on file    History reviewed. No pertinent family history.  Health Maintenance  Topic Date Due  . INFLUENZA VACCINE  05/25/2018 (Originally 02/25/2018)  . TETANUS/TDAP  09/22/2018 (Originally 07/27/2008)  . HIV Screening  09/22/2018 (Originally 07/27/2004)    ----------------------------------------------------------------------------------------------------------------------------------------------------------------------------------------------------------------- Physical Exam BP 100/70   Pulse 96   Temp 97.6 F (36.4 C) (Oral)   Ht 6' (1.829 m)   Wt 298 lb (135.2 kg)   SpO2 97%   BMI 40.42 kg/m   Physical Exam  Constitutional: He is oriented to person, place, and time. He appears well-nourished. No distress.  HENT:  Head: Normocephalic and atraumatic.  Mouth/Throat: Oropharynx is clear and  moist.  Eyes: No scleral icterus.  Neck: Neck supple. No thyromegaly present.  Cardiovascular: Normal rate, regular rhythm and normal heart sounds.  Pulmonary/Chest: Effort normal and breath sounds normal.  Musculoskeletal: He exhibits no edema.  Lymphadenopathy:    He has no cervical adenopathy.  Neurological: He is alert and oriented to person, place, and time.  Psychiatric: He has a normal mood and affect. His behavior is normal.     ------------------------------------------------------------------------------------------------------------------------------------------------------------------------------------------------------------------- Assessment and Plan  Cough -No history of asthma or wheezing on exam. ?cough variant asthma/reactive airway given worsening around fragrances.  -It sounds like reflux is contributing as well, start protonix -EKG completed for chest tightness, normal.  -If not improving we'll have him return for spirometry.  -Call for worsening symptoms.

## 2018-05-19 NOTE — Patient Instructions (Signed)
-  Your EKG looks ok. -If this cough continues over the next 2 weeks let me know and we'll have you come in for pulmonary function testing.

## 2018-05-19 NOTE — Assessment & Plan Note (Signed)
-  No history of asthma or wheezing on exam. ?cough variant asthma/reactive airway given worsening around fragrances.  -It sounds like reflux is contributing as well, start protonix -EKG completed for chest tightness, normal.  -If not improving we'll have him return for spirometry.  -Call for worsening symptoms.

## 2018-05-19 NOTE — Telephone Encounter (Signed)
Patient called and asked did he figure out what he was going to do about the cough, he says no. I advised there are other offices that he can go to, if he is willing to transfer to another provider, since his PCP has left [place], he agrees. Appointment scheduled for today at 1400 with Dr. Elmarie Shiley at [facility], patient advised to bring insurance card, co-pay, list of all medications that he takes and to arrive at 1345, he verbalized understanding.

## 2018-05-25 ENCOUNTER — Encounter: Payer: Self-pay | Admitting: Family Medicine

## 2018-06-30 ENCOUNTER — Ambulatory Visit: Payer: Self-pay

## 2018-06-30 NOTE — Telephone Encounter (Signed)
Out going call to Patient.Patient complains of feeling light headed and tired all the time.   States that room does appear to spinning. States that he feels that he is moving to quick. Patient rates the dizziness as mild.   Heart rate is 68.  Patient thinks that he move to quick inially  That his iron may be low.  Does not eat red meat.  And salads.  Encouraged Patient to eat more dark green leafy vegetables.Such as spinach   collard greens.  Patient voiced understanding.  Reviewed protocol with patient .  Recommend the patient be evaluated  by PCP within  2 weeks.  Patient declines appointment. Encouraged the patient  To call back if Sx worsen. Pt voiced understanding.   Reason for Disposition . Dizziness is a chronic symptom (recurrent or ongoing AND present > 4 weeks)  Answer Assessment - Initial Assessment Questions 1. DESCRIPTION: "Describe your dizziness."     *No Answer* 2. LIGHTHEADED: "Do you feel lightheaded?" (e.g., somewhat faint, woozy, weak upon standing)     yes 3. VERTIGO: "Do you feel like either you or the room is spinning or tilting?" (i.e. vertigo)     Sometimes maybe I move to fast.l 4. SEVERITY: "How bad is it?"  "Do you feel like you are going to faint?" "Can you stand and walk?"   - MILD - walking normally   - MODERATE - interferes with normal activities (e.g., work, school)    - SEVERE - unable to stand, requires support to walk, feels like passing out now.       Mild , because of the frequency that what make me wonder whats going on 5. ONSET:  "When did the dizziness begin?"     Since August 6. AGGRAVATING FACTORS: "Does anything make it worse?" (e.g., standing, change in head position)     Moving to fast.   7. HEART RATE: "Can you tell me your heart rate?" "How many beats in 15 seconds?"  (Note: not all patients can do this)       68 8. CAUSE: "What do you think is causing the dizziness?"     See notes9. RECURRENT SYMPTOM: "Have you had dizziness before?" If so, ask:  "When was the last time?" "What happened that time?"     No, more the tiredness came back10. OTHER SYMPTOMS: "Do you have any other symptoms?" (e.g., fever, chest pain, vomiting, diarrhea, bleeding)       -mild very mild chest pain.   11. PREGNANCY: "Is there any chance you are pregnant?" "When was your last menstrual period?"       na  Protocols used: DIZZINESS [facility]- LIGHTHEADEDNESS-A-AH

## 2018-06-30 NOTE — Telephone Encounter (Signed)
Subject: Appointment scheduled from MyChart          Appointment For: Micheal FabryChristopher Sider ([id number])  Visit Type: MYCHART OFFICE VISIT [phone])    07/01/2018  8:15 AM 15 mins. Everrett CoombeMatthews, Cody, DO    [facility]    Patient Comments:  Constantly feeling tired and dizzy. I want to do tests on me that will possibly show anything that may be the cause. Blood, urine, X-ray if possible, etc. to see if we can pinpoint what could be the cause.     Left message to return call to office

## 2018-07-01 ENCOUNTER — Ambulatory Visit: Payer: BC Managed Care – PPO | Admitting: Family Medicine

## 2018-07-13 ENCOUNTER — Ambulatory Visit: Payer: BC Managed Care – PPO | Admitting: Family Medicine

## 2018-07-13 ENCOUNTER — Encounter: Payer: Self-pay | Admitting: Family Medicine

## 2018-07-13 VITALS — BP 132/74 | HR 76 | Temp 97.6°F | Ht 72.0 in | Wt 292.0 lb

## 2018-07-13 DIAGNOSIS — R42 Dizziness and giddiness: Secondary | ICD-10-CM | POA: Insufficient documentation

## 2018-07-13 DIAGNOSIS — Z13220 Encounter for screening for lipoid disorders: Secondary | ICD-10-CM | POA: Diagnosis not present

## 2018-07-13 DIAGNOSIS — R7303 Prediabetes: Secondary | ICD-10-CM

## 2018-07-13 DIAGNOSIS — Z0000 Encounter for general adult medical examination without abnormal findings: Secondary | ICD-10-CM | POA: Diagnosis not present

## 2018-07-13 LAB — LIPID PANEL
CHOLESTEROL: 187 mg/dL (ref 0–200)
HDL: 37.6 mg/dL — AB (ref >39.00)
LDL CALC: 125 mg/dL — AB (ref 0–99)
NonHDL: 149.67
Total CHOL/HDL Ratio: 5
Triglycerides: 124 mg/dL (ref 0.0–149.0)
VLDL: 24.8 mg/dL (ref 0.0–40.0)

## 2018-07-13 LAB — COMPREHENSIVE METABOLIC PANEL
ALBUMIN: 4.6 g/dL (ref 3.5–5.2)
ALT: 14 U/L (ref 0–53)
AST: 16 U/L (ref 0–37)
Alkaline Phosphatase: 63 U/L (ref 39–117)
BUN: 11 mg/dL (ref 6–23)
CHLORIDE: 104 meq/L (ref 96–112)
CO2: 28 mEq/L (ref 19–32)
CREATININE: 1.07 mg/dL (ref 0.40–1.50)
Calcium: 9.9 mg/dL (ref 8.4–10.5)
GFR: 105.11 mL/min (ref >60.00)
Glucose, Bld: 91 mg/dL (ref 70–99)
POTASSIUM: 4.5 meq/L (ref 3.5–5.1)
Sodium: 138 mEq/L (ref 135–145)
TOTAL PROTEIN: 7.4 g/dL (ref 6.0–8.3)
Total Bilirubin: 0.6 mg/dL (ref 0.2–1.2)

## 2018-07-13 LAB — HEMOGLOBIN A1C: Hgb A1c MFr Bld: 5.8 % (ref 4.6–6.5)

## 2018-07-13 LAB — CBC
HEMATOCRIT: 46.6 % (ref 39.0–52.0)
Hemoglobin: 15.3 g/dL (ref 13.0–17.0)
MCHC: 32.8 g/dL (ref 30.0–36.0)
MCV: 86.1 fl (ref 78.0–100.0)
Platelets: 310 10*3/uL (ref 150.0–400.0)
RBC: 5.41 Mil/uL (ref 4.22–5.81)
RDW: 15.2 % (ref 11.5–15.5)
WBC: 8.2 10*3/uL (ref 4.0–10.5)

## 2018-07-13 LAB — TSH: TSH: 1.12 u[IU]/mL (ref 0.35–4.50)

## 2018-07-13 NOTE — Assessment & Plan Note (Signed)
Well adult Orders Placed This Encounter  Procedures  . Comp Met (CMET)  . CBC  . Lipid Profile  . TSH  . HgB A1c  Immunizations: Up to date Screenings:  Lipid Anticipatory Guidance/Risk factor reduction:  Per AVS

## 2018-07-13 NOTE — Assessment & Plan Note (Addendum)
-  Wax impaction to R ear easily removed with ear lavage -There is no effusion noted.  -Labs ordered today.

## 2018-07-13 NOTE — Progress Notes (Signed)
Micheal Gross - 27 y.o. male MRN [id number]  Date of birth: Jan 24, 1991  Subjective Chief Complaint  Patient presents with  . Headache    Ongoing for 2-3 weeks. Admits to fatigue and dizziness. He has been taking Tylenol with some improvement.   . Other    Wants to discuss labs    HPI Micheal Gross is a 27 y.o. male with history of GERD here today for annual exam.  He also complains of fullness to R ear with feeling of intermittent dizziness and fatigue.  He denies associated chest pain, shortness of breath or palpitations.   He is a non-smoker.  He has had elevated blood sugar in the past but no diagnosis of diabetes.  He has started exercising more and weight is down about 16 lbs based on home scales.    Review of Systems  Constitutional: Positive for malaise/fatigue. Negative for chills, fever and weight loss.  HENT: Negative for congestion, ear pain and sore throat.   Eyes: Negative for blurred vision, double vision and pain.  Respiratory: Negative for cough and shortness of breath.   Cardiovascular: Negative for chest pain and palpitations.  Gastrointestinal: Negative for abdominal pain, blood in stool, constipation, heartburn and nausea.  Genitourinary: Negative for dysuria and urgency.  Musculoskeletal: Negative for joint pain and myalgias.  Neurological: Positive for dizziness. Negative for headaches.  Endo/Heme/Allergies: Does not bruise/bleed easily.  Psychiatric/Behavioral: Negative for depression. The patient is not nervous/anxious and does not have insomnia.     No Known Allergies  History reviewed. No pertinent past medical history.  History reviewed. No pertinent surgical history.  Social History   Socioeconomic History  . Marital status: Single    Spouse name: Not on file  . Number of children: Not on file  . Years of education: Not on file  . Highest education level: Not on file  Occupational History  . Not on file  Social Needs  . Financial  resource strain: Not on file  . Food insecurity:    Worry: Not on file    Inability: Not on file  . Transportation needs:    Medical: Not on file    Non-medical: Not on file  Tobacco Use  . Smoking status: Never Smoker  . Smokeless tobacco: Never Used  Substance and Sexual Activity  . Alcohol use: Yes    Comment: occasional  . Drug use: No  . Sexual activity: Not on file  Lifestyle  . Physical activity:    Days per week: Not on file    Minutes per session: Not on file  . Stress: Not on file  Relationships  . Social connections:    Talks on phone: Not on file    Gets together: Not on file    Attends religious service: Not on file    Active member of club or organization: Not on file    Attends meetings of clubs or organizations: Not on file    Relationship status: Not on file  Other Topics Concern  . Not on file  Social History Narrative  . Not on file    History reviewed. No pertinent family history.  Health Maintenance  Topic Date Due  . TETANUS/TDAP  09/22/2018 (Originally 07/27/2008)  . HIV Screening  09/22/2018 (Originally 07/27/2004)  . INFLUENZA VACCINE  10/26/2018 (Originally 02/25/2018)    ----------------------------------------------------------------------------------------------------------------------------------------------------------------------------------------------------------------- Physical Exam BP 132/74   Pulse 76   Temp 97.6 F (36.4 C) (Oral)   Ht 6' (1.829 m)   Wt 292 lb (  132.5 kg)   SpO2 96%   BMI 39.60 kg/m   Physical Exam Constitutional:      General: He is not in acute distress. HENT:     Head: Normocephalic and atraumatic.     Right Ear: Tympanic membrane normal. There is impacted cerumen.     Left Ear: Tympanic membrane and external ear normal.  Eyes:     General: No scleral icterus. Neck:     Musculoskeletal: Normal range of motion.     Thyroid: No thyromegaly.  Cardiovascular:     Rate and Rhythm: Normal rate and  regular rhythm.     Heart sounds: Normal heart sounds.  Pulmonary:     Effort: Pulmonary effort is normal.     Breath sounds: Normal breath sounds.  Abdominal:     General: Bowel sounds are normal. There is no distension.     Palpations: Abdomen is soft.     Tenderness: There is no abdominal tenderness. There is no guarding.  Lymphadenopathy:     Cervical: No cervical adenopathy.  Skin:    General: Skin is warm and dry.     Findings: No rash.  Neurological:     Mental Status: He is alert and oriented to person, place, and time.     Cranial Nerves: No cranial nerve deficit.     Motor: No abnormal muscle tone.  Psychiatric:        Mood and Affect: Mood normal.        Behavior: Behavior normal.     ------------------------------------------------------------------------------------------------------------------------------------------------------------------------------------------------------------------- Assessment and Plan  Well adult exam Well adult Orders Placed This Encounter  Procedures  . Comp Met (CMET)  . CBC  . Lipid Profile  . TSH  . HgB A1c  Immunizations: Up to date Screenings:  Lipid Anticipatory Guidance/Risk factor reduction:  Per AVS  Dizziness -Wax impaction to R ear easily removed with ear lavage -There is no effusion noted.  -Labs ordered today.

## 2018-07-13 NOTE — Patient Instructions (Addendum)
Adult Wellness Guidelines   Adult Health - for Ages 18 and Over Preventive care is very important for adults. By making some good basic health choices, women and men can boost their own health and well-being. Some of these positive choices include:   Eat a healthy diet  Get regular exercise  Don't use tobacco products  Limit alcohol use  Strive for a healthy weight  Adult Recommendations Screenings Physical Exam Every year, or as directed by your doctor. Body Mass Index (BMI) Every year. Blood Pressure (BP) At least every two years. Colon Cancer Screening Beginning at age 50 - colonoscopy every 10 years, or flexible sigmoidoscopy every five years or fecal blood test annually. Diabetes Screening Those with high blood pressure or high cholesterol should be screened. Others, especially those who are overweight or have a close family history of diabetes, should consider being screened every three years. Vision Screening Every year.  Immunizations Tetanus, Diphtheria, Pertussis (Td/ Tdap) Get Tdap vaccine once, then a Td booster every 10 years. Influenza (Flu) Every year. Herpes Zoster (Shingles) One dose given at age 60 and over. Varicella (Chicken Pox) Two doses if no evidence of immunity. Pneumococcal (Pneumonia) One or two doses for adults age 65 and older, or one or two doses depending on indication. Measles, Mumps, Rubella (MMR) One or two doses for adults ages 18-55 if no evidence of immunity. Human Papillomavirus (HPV) Three doses for women ages 19-26 if not already given. Three doses for men ages 19-21 if not already given.* Hepatitis A Two or three doses for adults age 19 and over.** Hepatitis B Three doses for ages 19 and over.** * Recommendations may vary. Discuss the start and frequency of screenings with your doctor, especially if you are at increased risk. ** For select populations. Discuss with your doctor if this vaccine is right for you.  Women's Health Women  have their own unique health care needs. To stay well, they should make regular screenings a priority. Women should discuss the recommendations listed on the chart with their doctors. Women's Recommendations Mammogram Every year for women beginning at age 40.* Cholesterol Starting age and frequency of screenings are based on your individual risk factors. Talk with your doctor about what is best for you. Pap Test Women ages 21-65: Pap test every three years. Another option for ages 30-65: Pap test and HPV test every five years. Women who have had a hysterectomy or are over age 65 may not need a Pap test.* Osteoporosis Screening Beginning at age 65, or at age 60 if risk factors are present.* Aspirin Use At ages 55-79, talk with your doctor about the benefits and risks of aspirin use. Pelvic Exam Every year for ages 19 and over. Folic Acid Women planning/capable of pregnancy should take a daily supplement containing .4-.8 mg of folic acid for prevention of neural tube defects.  * Recommendations may vary. Discuss screening options with your doctor, especially if you are at increased risk. Sources: American Cancer Society, U.S. Department of Health and Human Services and the Centers for Disease Control and Prevention, U.S. Preventive Services Task Force   Men's Health Recommendations Men are encouraged to get care as needed and make smart choices. That includes following a healthy lifestyle and getting recommended preventive care services.  Recommended preventative care services are as follows:  Cholesterol Ages 20-35 should be tested if at high risk. Men age 35 and over should be tested. Prostate Cancer Screening Ages 50 and over, discuss the benefits and risks   of screening with your doctor.* Abdominal Aortic Aneurysm Once between ages 65 and 75 if you have ever smoked.  

## 2018-07-15 NOTE — Progress Notes (Signed)
-  Mild elevation in cholesterol.  Recommend low fat diet with regular exercise.  -Other labs are normal.  No signs of anemia or dehydration contributing to dizzy feeling.   -Wax removal may help with this, please let him know to keep me updated if dizzy feeling doesn't improve.

## 2018-07-19 ENCOUNTER — Encounter: Payer: Self-pay | Admitting: Family Medicine

## 2018-07-19 ENCOUNTER — Other Ambulatory Visit: Payer: Self-pay | Admitting: Family Medicine

## 2018-07-19 DIAGNOSIS — R51 Headache: Principal | ICD-10-CM

## 2018-07-19 DIAGNOSIS — R519 Headache, unspecified: Secondary | ICD-10-CM

## 2018-07-28 ENCOUNTER — Encounter: Payer: Self-pay | Admitting: Family Medicine

## 2018-08-04 ENCOUNTER — Encounter: Payer: Self-pay | Admitting: Neurology

## 2018-08-04 ENCOUNTER — Ambulatory Visit: Payer: BC Managed Care – PPO | Admitting: Neurology

## 2018-08-04 VITALS — BP 122/78 | HR 65 | Ht 71.5 in | Wt 288.0 lb

## 2018-08-04 DIAGNOSIS — R519 Headache, unspecified: Secondary | ICD-10-CM

## 2018-08-04 DIAGNOSIS — R51 Headache: Secondary | ICD-10-CM

## 2018-08-04 DIAGNOSIS — G44209 Tension-type headache, unspecified, not intractable: Secondary | ICD-10-CM | POA: Diagnosis not present

## 2018-08-04 DIAGNOSIS — G4719 Other hypersomnia: Secondary | ICD-10-CM

## 2018-08-04 DIAGNOSIS — R0683 Snoring: Secondary | ICD-10-CM

## 2018-08-04 NOTE — Progress Notes (Signed)
Subjective:    Patient ID: Micheal Gross is a 28 y.o. male.  HPI     Huston Foley, MD, PhD [facility] [address] [address] [place], [place] [address]  Dear Dr. Ashley Royalty,   I saw your patient, Micheal Gross, upon your kind request in my neurologic clinic today for initial consultation of his recurrent headaches. The patient is accompanied by his GF today. As you know, Micheal Gross is a 28 year old right-handed gentleman with an underlying overall benign medical history with the exception of obesity, who reports recurrent headaches for the past several weeks or months, about 2+ months. I reviewed your office note from 07/13/2018. He was complaining of dizzy spells and fatigue as well. His Epworth sleepiness score is 13 out of 24 today, fatigue score is 45 out of 63. He does snore. He does not wake up rested. He has woken up with a headache. He lives with his girlfriend, he works at [employer] They have no children. He is a nonsmoker and does not utilize alcohol and does not drink caffeine on a regular basis. He reports a headache frequency nearly daily. Describes a tightness and bandlike sensation, sometimes tingling on top of his head, no classic migrainous features such as throbbing or one-sided headaches. Sometimes he has sensitivity to sound typically no significance sensitivity to light or nausea or vomiting. He has no prior history of migraine headaches growing up or as a teenager or preteen and no family history of migraines, no obvious family history of OSA. He does snore, has nocturia about once per average night, tries to make enough time for sleep, typically in bed by 11 and rise time is 7. He had some blood work in December which was benign including A1c, TSH, CBC, CMP but LDL was elevated and HDL lower. He has had some chest pain in the past and had an EKG in October, he was also treated for reflux at that point. He  tried his muscle relaxant recently for alleviation of his headache but was too tired the next day from it. He had a muscle relaxant left over from when he had a muscle strain in his right hamstrings.                                                                                                     His Past Medical History Is Significant For: No past medical history on file.  His Past Surgical History Is Significant For: No past surgical history on file.  His Family History Is Significant For: No family history on file.  His Social History Is Significant For: Social History   Socioeconomic History  . Marital status: Single    Spouse name: Not on file  . Number of children: Not on file  . Years of education: Not on file  . Highest education level: Not on file  Occupational History  . Not on file  Social Needs  . Financial resource strain: Not on file  . Food insecurity:    Worry: Not on file  Inability: Not on file  . Transportation needs:    Medical: Not on file    Non-medical: Not on file  Tobacco Use  . Smoking status: Never Smoker  . Smokeless tobacco: Never Used  Substance and Sexual Activity  . Alcohol use: Yes    Comment: occasional  . Drug use: No  . Sexual activity: Not on file  Lifestyle  . Physical activity:    Days per week: Not on file    Minutes per session: Not on file  . Stress: Not on file  Relationships  . Social connections:    Talks on phone: Not on file    Gets together: Not on file    Attends religious service: Not on file    Active member of club or organization: Not on file    Attends meetings of clubs or organizations: Not on file    Relationship status: Not on file  Other Topics Concern  . Not on file  Social History Narrative  . Not on file    His Allergies Are:  No Known Allergies:   His Current Medications Are:  No outpatient encounter medications on file as of 08/04/2018.   No facility-administered encounter medications on  file as of 08/04/2018.   :  Review of Systems:  Out of a complete 14 point review of systems, all are reviewed and negative with the exception of these symptoms as listed below: Review of Systems  Neurological:       Pt presents today to discuss his sleep and headaches. Pt gets 20 headaches per month and they last all day. Pt wakes up with the headache. Pt has never had a sleep study but does endorse snoring.  Epworth Sleepiness Scale 0= would never doze 1= slight chance of dozing 2= moderate chance of dozing 3= high chance of dozing  Sitting and reading: 1 Watching TV: 2 Sitting inactive in a public place (ex. Theater or meeting): 1 As a passenger in a car for an hour without a break: 2 Lying down to rest in the afternoon: 3 Sitting and talking to someone: 1 Sitting quietly after lunch (no alcohol): 3 In a car, while stopped in traffic: 0 Total: 13     Objective:  Neurological Exam  Physical Exam Physical Examination:   Vitals:   08/04/18 1509  BP: 122/78  Pulse: 65  SpO2: 95%    General Examination: The patient is a very pleasant 28 y.o. male in no acute distress. He appears well-developed and well-nourished and well groomed.   HEENT: Normocephalic, atraumatic, pupils are equal, round and reactive to light and accommodation. Corrective eyeglasses in place. Extraocular tracking is good without limitation to gaze excursion or nystagmus noted. Normal smooth pursuit is noted. Hearing is grossly intact. Face is symmetric with normal facial animation and normal facial sensation. Speech is clear with no dysarthria noted. There is no hypophonia. There is no lip, neck/head, jaw or voice tremor. Neck is supple with full range of passive and active motion. There are no carotid bruits on auscultation. Oropharynx exam reveals: mild mouth dryness, adequate dental hygiene and moderate airway crowding, due to tonsillar size of about 2+ bilaterally, larger appearing uvula, Mallampati is  class III. Tongue protrudes centrally and palate elevates symmetrically. Neck circumference is 18 1/8 in. He has a mild overbite.  Chest: Clear to auscultation without wheezing, rhonchi or crackles noted.  Heart: S1+S2+0, regular and normal without murmurs, rubs or gallops noted.   Abdomen: Soft, non-tender and non-distended  with normal bowel sounds appreciated on auscultation.  Extremities: There is no pitting edema in the distal lower extremities bilaterally.   Skin: Warm and dry without trophic changes noted.  Musculoskeletal: exam reveals no obvious joint deformities, tenderness or joint swelling or erythema.   Neurologically:  Mental status: The patient is awake, alert and oriented in all 4 spheres. His immediate and remote memory, attention, language skills and fund of knowledge are appropriate. There is no evidence of aphasia, agnosia, apraxia or anomia. Speech is clear with normal prosody and enunciation. Thought process is linear. Mood is normal and affect is normal.  Cranial nerves II - XII are as described above under HEENT exam. In addition: shoulder shrug is normal with equal shoulder height noted. Motor exam: Normal bulk, strength and tone is noted. There is no drift, tremor or rebound. Romberg is negative. Reflexes are 2+ throughout. Fine motor skills and coordination: intact with normal finger taps, normal hand movements, normal rapid alternating patting, normal foot taps and normal foot agility.  Cerebellar testing: No dysmetria or intention tremor on finger to nose testing. Heel to shin is unremarkable bilaterally. There is no truncal or gait ataxia.  Sensory exam: intact to light touch in the upper and lower extremities.  Gait, station and balance: He stands easily. No veering to one side is noted. No leaning to one side is noted. Posture is age-appropriate and stance is narrow based. Gait shows normal stride length and normal pace. No problems turning are noted. Tandem walk is  unremarkable.  Assessment and Plan:  In summary, Micheal Gross is a very pleasant 28 y.o.-year old male with an underlying overall benign medical history with the exception of obesity, who presents for evaluation of his recurrent headaches. The description of his headaches is not telltale for migrainous headaches. He may have a tension headache component but also given the fact that he has a crowded airway, a enlarged next size, history of snoring, and elevated BMI, he may be at risk for obstructive sleep apnea which can often cause recurrent headaches in particular morning headaches. I talked to the patient at length about this. He is willing to get tested for sleep apnea with a sleep study. I think it is important that we rule out sleep apnea first. Thankfully, his exam and neurological exam are nonfocal. We may think about imaging such as an MRI in the near future.  I did not suggest any new medications quite yet for him. I would like to see if he would benefit from treatment of possible underlying sleep apnea first. He is in agreement with this. I talked to the patient and his girlfriend about sleep apnea and its prognosis and treatment options including the use of CPAP, surgical options, weight loss and a dental device potentially. He would be willing to try CPAP therapy. To that end, I will see him back after his sleep study is completed, I explained the sleep test procedure to him as well. I answered all their questions today and he was in agreement.  Thank you very much for allowing me to participate in the care of this nice patient. If I can be of any further assistance to you please do not hesitate to call me at [phone].  Sincerely,   Huston FoleySaima Porshe Fleagle, MD, PhD

## 2018-08-04 NOTE — Patient Instructions (Signed)
Thank you for choosing [facility] for your sleep related care! It was nice to meet you today! I appreciate that you entrust me with your sleep related healthcare concerns. I hope, I was able to address at least some of your concerns today, and that I can help you feel reassured and also get better.    Here is what we discussed today and what we came up with as our plan for you:   You may have more tension type than migrainous headaches.    Based on your symptoms and your exam I believe you are at risk for obstructive sleep apnea (aka OSA), and I think we should proceed with a sleep study to determine whether you do or do not have OSA and how severe it is. Even, if you have mild OSA, I may want you to consider treatment with CPAP, as treatment of even borderline or mild sleep apnea can result and improvement of symptoms such as sleep disruption, daytime sleepiness, nighttime bathroom breaks, restless leg symptoms, improvement of headache syndromes, even improved mood disorder.   Please remember, the long-term risks and ramifications of untreated moderate to severe obstructive sleep apnea are: increased Cardiovascular disease, including congestive heart failure, stroke, difficult to control hypertension, treatment resistant obesity, arrhythmias, especially irregular heartbeat commonly known as A. Fib. (atrial fibrillation); even type 2 diabetes has been linked to untreated OSA.   Sleep apnea can cause disruption of sleep and sleep deprivation in most cases, which, in turn, can cause recurrent headaches, problems with memory, mood, concentration, focus, and vigilance. Most people with untreated sleep apnea report excessive daytime sleepiness, which can affect their ability to drive. Please do not drive if you feel sleepy. Patients with sleep apnea developed difficulty initiating and maintaining sleep (aka insomnia).   Having sleep apnea may increase your risk for other sleep disorders,  including involuntary behaviors sleep such as sleep terrors, sleep talking, sleepwalking.    Having sleep apnea can also increase your risk for restless leg syndrome and leg movements at night.   Please note that untreated obstructive sleep apnea may carry additional perioperative morbidity. Patients with significant obstructive sleep apnea (typically, in the moderate to severe degree) should receive, if possible, perioperative PAP (positive airway pressure) therapy and the surgeons and particularly the anesthesiologists should be informed of the diagnosis and the severity of the sleep disordered breathing.   I will likely see you back after your sleep study to go over the test results and where to go from there. We will call you after your sleep study to advise about the results (most likely, you will hear from [place], my nurse) and to set up an appointment at the time, as necessary.    Our sleep lab administrative assistant will call you to schedule your sleep study and give you further instructions, regarding the check in process for the sleep study, arrival time, what to bring, when you can expect to leave after the study, etc., and to answer any other logistical questions you may have. If you don't hear back from her by about 2 weeks from now, please feel free to call her direct line at [phone] or you can call our general clinic number, or email [place] through My Chart.

## 2018-08-09 ENCOUNTER — Ambulatory Visit: Payer: BC Managed Care – PPO | Admitting: Family Medicine

## 2018-08-09 ENCOUNTER — Encounter: Payer: Self-pay | Admitting: Family Medicine

## 2018-08-09 VITALS — BP 136/74 | HR 66 | Temp 98.1°F | Ht 71.5 in | Wt 285.0 lb

## 2018-08-09 DIAGNOSIS — M2550 Pain in unspecified joint: Secondary | ICD-10-CM | POA: Insufficient documentation

## 2018-08-09 DIAGNOSIS — M791 Myalgia, unspecified site: Secondary | ICD-10-CM | POA: Diagnosis not present

## 2018-08-09 DIAGNOSIS — R202 Paresthesia of skin: Secondary | ICD-10-CM

## 2018-08-09 LAB — CK: Total CK: 111 U/L (ref 7–232)

## 2018-08-09 LAB — VITAMIN B12: Vitamin B-12: 316 pg/mL (ref 211–911)

## 2018-08-09 LAB — URIC ACID: Uric Acid, Serum: 8.3 mg/dL — ABNORMAL HIGH (ref 4.0–7.8)

## 2018-08-09 LAB — SEDIMENTATION RATE: SED RATE: 38 mm/h — AB (ref 0–15)

## 2018-08-09 NOTE — Assessment & Plan Note (Signed)
-  Non-specific symptoms may be related to anxiety however will check for other causes including rheumatological disease. Orders Placed This Encounter  Procedures  . Sedimentation rate  . Uric acid  . Antinuclear Antib (ANA)  . CK (Creatine Kinase)  . B12

## 2018-08-09 NOTE — Patient Instructions (Signed)
We'll be in touch with lab results once these return

## 2018-08-09 NOTE — Progress Notes (Signed)
Micheal Gross - 28 y.o. male MRN [id number]  Date of birth: 16-Apr-1991  Subjective Chief Complaint  Patient presents with  . Leg Pain    Left thigh pain-noticed pain this morning-woke him up this morning.  He has been having ongoing pain/aches in his arms and legs for one month.     HPI Micheal Gross is a 28 y.o. male here today with complaint of leg pain.  He reports that leg pain woke him from sleep this morning.  He tried stretching with some improvement.  Had nearly resolved until around noon today when it started again.  Has had similar pain in upper extremities as well as tingling sensation in his face at times.  Describes pain as feeling like tightness or sensation that she worked out too hard.  He has had some associated numbness and tingling in extremities.  He has not noticed joint swelling.   ROS:  A comprehensive ROS was completed and negative except as noted per HPI  No Known Allergies  No past medical history on file.  No past surgical history on file.  Social History   Socioeconomic History  . Marital status: Single    Spouse name: Not on file  . Number of children: Not on file  . Years of education: Not on file  . Highest education level: Not on file  Occupational History  . Not on file  Social Needs  . Financial resource strain: Not on file  . Food insecurity:    Worry: Not on file    Inability: Not on file  . Transportation needs:    Medical: Not on file    Non-medical: Not on file  Tobacco Use  . Smoking status: Never Smoker  . Smokeless tobacco: Never Used  Substance and Sexual Activity  . Alcohol use: Yes    Comment: occasional  . Drug use: No  . Sexual activity: Not on file  Lifestyle  . Physical activity:    Days per week: Not on file    Minutes per session: Not on file  . Stress: Not on file  Relationships  . Social connections:    Talks on phone: Not on file    Gets together: Not on file    Attends religious service: Not on  file    Active member of club or organization: Not on file    Attends meetings of clubs or organizations: Not on file    Relationship status: Not on file  Other Topics Concern  . Not on file  Social History Narrative  . Not on file    No family history on file.  Health Maintenance  Topic Date Due  . TETANUS/TDAP  09/22/2018 (Originally 07/27/2008)  . HIV Screening  09/22/2018 (Originally 07/27/2004)  . INFLUENZA VACCINE  10/26/2018 (Originally 02/25/2018)    ----------------------------------------------------------------------------------------------------------------------------------------------------------------------------------------------------------------- Physical Exam BP 136/74   Pulse 66   Temp 98.1 F (36.7 C) (Oral)   Ht 5' 11.5" (1.816 m)   Wt 285 lb (129.3 kg)   SpO2 97%   BMI 39.20 kg/m   Physical Exam Constitutional:      Appearance: Normal appearance.  HENT:     Head: Normocephalic and atraumatic.     Mouth/Throat:     Mouth: Mucous membranes are moist.  Eyes:     General: No scleral icterus. Cardiovascular:     Rate and Rhythm: Normal rate.     Pulses: Normal pulses.     Heart sounds: Normal heart sounds.  Pulmonary:  Effort: Pulmonary effort is normal.     Breath sounds: Normal breath sounds.  Musculoskeletal:        General: No swelling or tenderness.  Skin:    General: Skin is warm and dry.  Neurological:     General: No focal deficit present.     Mental Status: He is alert and oriented to person, place, and time.     Cranial Nerves: No cranial nerve deficit.     Sensory: No sensory deficit.     Motor: No weakness.     Coordination: Coordination normal.     Gait: Gait normal.     Deep Tendon Reflexes: Reflexes normal.  Psychiatric:        Mood and Affect: Mood normal.        Behavior: Behavior normal.      ------------------------------------------------------------------------------------------------------------------------------------------------------------------------------------------------------------------- Assessment and Plan  Arthralgia -Non-specific symptoms may be related to anxiety however will check for other causes including rheumatological disease. Orders Placed This Encounter  Procedures  . Sedimentation rate  . Uric acid  . Antinuclear Antib (ANA)  . CK (Creatine Kinase)  . B12

## 2018-08-11 ENCOUNTER — Encounter: Payer: Self-pay | Admitting: Family Medicine

## 2018-08-11 LAB — ANA: ANA: NEGATIVE

## 2018-08-11 MED ORDER — INDOMETHACIN 50 MG PO CAPS: 50.0000 mg | ORAL_CAPSULE | Freq: Three times a day (TID) | ORAL | 0 refills | Status: DC | PRN

## 2018-08-27 ENCOUNTER — Encounter: Payer: Self-pay | Admitting: Neurology

## 2018-11-10 ENCOUNTER — Ambulatory Visit (INDEPENDENT_AMBULATORY_CARE_PROVIDER_SITE_OTHER): Payer: BC Managed Care – PPO | Admitting: Family Medicine

## 2018-11-10 ENCOUNTER — Encounter: Payer: Self-pay | Admitting: Family Medicine

## 2018-11-10 DIAGNOSIS — M5417 Radiculopathy, lumbosacral region: Secondary | ICD-10-CM

## 2018-11-10 DIAGNOSIS — M5410 Radiculopathy, site unspecified: Secondary | ICD-10-CM | POA: Insufficient documentation

## 2018-11-10 MED ORDER — METHYLPREDNISOLONE 4 MG PO TBPK: ORAL_TABLET | ORAL | 0 refills | Status: DC

## 2018-11-10 NOTE — Progress Notes (Signed)
Micheal Gross - 28 y.o. male MRN [id number]  Date of birth: 02/05/1991   This visit type was conducted due to national recommendations for restrictions regarding the COVID-19 Pandemic (e.g. social distancing).  This format is felt to be most appropriate for this patient at this time.  All issues noted in this document were discussed and addressed.  No physical exam was performed (except for noted visual exam findings with Video Visits).  I discussed the limitations of evaluation and management by telemedicine and the availability of in person appointments. The patient expressed understanding and agreed to proceed.  I connected with@ on 11/10/18 at 10:30 AM EDT by a video enabled telemedicine application and verified that I am speaking with the correct person using two identifiers.   Patient Location: Home [address] [place] [address]   Provider location:   [facility]  Chief Complaint  Patient presents with  . Leg Pain    Bilateral intermitt , onset:3-85mo ago,tingling/numbness, Ice therapy/OTC meds    HPI  Micheal Gross is a 28 y.o. male who presents via audio/video conferencing for a telehealth visit today.  He has complaint of leg pain.  Pain is bilateral R>L.  Onset was about 3-4 months ago with worsening recently.  His pain is intermittent.  He does have some numbness and tingling as well.  Sometimes legs feel weak and heavy.  Denies cramping.  He has been icing and using OTC medications without much improvement.  He does have some mild back pain.  He denies fever or chills, bowel or bladder incontinence.     ROS:  A comprehensive ROS was completed and negative except as noted per HPI  No past medical history on file.  No past surgical history on file.  No family history on file.  Social History   Socioeconomic History  . Marital status: Single    Spouse name: Not on file  . Number of children: Not on file  . Years of education: Not on file  .  Highest education level: Not on file  Occupational History  . Not on file  Social Needs  . Financial resource strain: Not on file  . Food insecurity:    Worry: Not on file    Inability: Not on file  . Transportation needs:    Medical: Not on file    Non-medical: Not on file  Tobacco Use  . Smoking status: Never Smoker  . Smokeless tobacco: Never Used  Substance and Sexual Activity  . Alcohol use: Yes    Comment: occasional  . Drug use: No  . Sexual activity: Not on file  Lifestyle  . Physical activity:    Days per week: Not on file    Minutes per session: Not on file  . Stress: Not on file  Relationships  . Social connections:    Talks on phone: Not on file    Gets together: Not on file    Attends religious service: Not on file    Active member of club or organization: Not on file    Attends meetings of clubs or organizations: Not on file    Relationship status: Not on file  . Intimate partner violence:    Fear of current or ex partner: Not on file    Emotionally abused: Not on file    Physically abused: Not on file    Forced sexual activity: Not on file  Other Topics Concern  . Not on file  Social History Narrative  .  Not on file     Current Outpatient Medications:  .  indomethacin (INDOCIN) 50 MG capsule, Take 1 capsule (50 mg total) by mouth 3 (three) times daily as needed for mild pain or moderate pain. (Patient not taking: Reported on 11/10/2018), Disp: 90 capsule, Rfl: 0 .  methylPREDNISolone (MEDROL) 4 MG TBPK tablet, Take as directed on packaging., Disp: 21 tablet, Rfl: 0  EXAM:  VITALS per patient if applicable: Pulse (!) 58 Comment: Smartwatch pt reported  Temp 97.9 F (36.6 C) (Oral) Comment: last temp taken last week  Wt 271 lb (122.9 kg) Comment: pt wt this am @ hm today  BMI 37.27 kg/m   GENERAL: alert, oriented, appears well and in no acute distress  HEENT: atraumatic, conjunttiva clear, no obvious abnormalities on inspection of external nose  and ears  NECK: normal movements of the head and neck  LUNGS: on inspection no signs of respiratory distress, breathing rate appears normal, no obvious gross SOB  CV: no obvious cyanosis  MS: moves all visible extremities without noticeable abnormality  PSYCH/NEURO: pleasant and cooperative, no obvious depression or anxiety, speech and thought processing grossly intact  ASSESSMENT AND PLAN:  Discussed the following assessment and plan:  Radiculopathy -Consistent with lumbar radiculopathy/sciatica.  -Start medrol dosepak -HEP sent via mychart.   -He will let me know if not improving.        I discussed the assessment and treatment plan with the patient. The patient was provided an opportunity to ask questions and all were answered. The patient agreed with the plan and demonstrated an understanding of the instructions.   The patient was advised to call back or seek an in-person evaluation if the symptoms worsen or if the condition fails to improve as anticipated.     [name] [name], DO

## 2018-11-10 NOTE — Assessment & Plan Note (Signed)
-  Consistent with lumbar radiculopathy/sciatica.  -Start medrol dosepak -HEP sent via mychart.   -He will let me know if not improving.

## 2018-11-17 ENCOUNTER — Telehealth: Payer: Self-pay | Admitting: Family Medicine

## 2018-11-17 NOTE — Telephone Encounter (Signed)
Can we set him up with Dr. Jordan Likes for evaluation of this?

## 2018-11-17 NOTE — Telephone Encounter (Signed)
Tried to call and Schedule appointment with Dr Jordan Likes for ultra sound for leg pain eval per Raynelle Fanning. Left message

## 2018-11-19 ENCOUNTER — Other Ambulatory Visit: Payer: Self-pay

## 2018-11-19 ENCOUNTER — Ambulatory Visit: Payer: BC Managed Care – PPO | Admitting: Family Medicine

## 2018-11-19 ENCOUNTER — Encounter: Payer: Self-pay | Admitting: Family Medicine

## 2018-11-19 DIAGNOSIS — M5417 Radiculopathy, lumbosacral region: Secondary | ICD-10-CM

## 2018-11-19 MED ORDER — IBUPROFEN-FAMOTIDINE 800-26.6 MG PO TABS: 1.0000 | ORAL_TABLET | Freq: Three times a day (TID) | ORAL | 3 refills | Status: DC

## 2018-11-19 MED ORDER — GABAPENTIN 100 MG PO CAPS: 100.0000 mg | ORAL_CAPSULE | Freq: Three times a day (TID) | ORAL | 1 refills | Status: DC

## 2018-11-19 NOTE — Assessment & Plan Note (Signed)
Pain seems to be associated with radiculopathy.  This would be more consistent with a central lesion if it is occurring bilaterally.  Less likely for blood clot or peripheral arterial disease.  Uric acid was elevated 8.3 but does not seem to be associated with gout.  Other lab work was nonrevealing. -Initiate gabapentin and Duexis. - Counseled on home exercise therapy and supportive care. -If no improvement would consider imaging or EMG or a muscle relaxer.  Could consider testing ferritin

## 2018-11-19 NOTE — Patient Instructions (Signed)
Nice to meet you  Please ask how you can get a stand up desk.  Please try the exercises  Please try the gabapentin. Please start with one pill at night. You can increase to two and three times daily as you tolerate.  Please send me a message in MyChart with any questions or updates.

## 2018-11-19 NOTE — Progress Notes (Signed)
Micheal Gross - 28 y.o. male MRN [id number]  Date of birth: Jun 17, 1991  SUBJECTIVE:  Including CC & ROS.  No chief complaint on file.   Micheal Gross is a 28 y.o. male that is presenting with bilateral leg pain.  The pain is been ongoing for 6 to 8 weeks.  The pain is intermittent in nature.  He feels it on the lateral aspect of the left and right leg.  It goes down to his ankles.  He denies any specific inciting event.  He had limited improvement with the prednisone pack.  Denies any history of surgery or similar instances.  The pain is sharp but can also be an altered sensation in nature.  Denies any weakness.  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    Review of Systems  Constitutional: Negative for fever.  HENT: Negative for congestion.   Respiratory: Negative for cough.   Cardiovascular: Negative for chest pain.  Gastrointestinal: Negative for abdominal pain.  Musculoskeletal: Negative for gait problem.  Skin: Negative for color change.  Neurological: Positive for numbness.  Hematological: Negative for adenopathy.    HISTORY: Past Medical, Surgical, Social, and Family History Reviewed & Updated per EMR.   Pertinent Historical Findings include:  No past medical history on file.  No past surgical history on file.  No Known Allergies  No family history on file.   Social History   Socioeconomic History  . Marital status: Single    Spouse name: Not on file  . Number of children: Not on file  . Years of education: Not on file  . Highest education level: Not on file  Occupational History  . Not on file  Social Needs  . Financial resource strain: Not on file  . Food insecurity:    Worry: Not on file    Inability: Not on file  . Transportation needs:    Medical: Not on file    Non-medical: Not on file  Tobacco Use  . Smoking status: Never Smoker  . Smokeless tobacco: Never Used  Substance and Sexual Activity  . Alcohol use: Yes    Comment: occasional   . Drug use: No  . Sexual activity: Not on file  Lifestyle  . Physical activity:    Days per week: Not on file    Minutes per session: Not on file  . Stress: Not on file  Relationships  . Social connections:    Talks on phone: Not on file    Gets together: Not on file    Attends religious service: Not on file    Active member of club or organization: Not on file    Attends meetings of clubs or organizations: Not on file    Relationship status: Not on file  . Intimate partner violence:    Fear of current or ex partner: Not on file    Emotionally abused: Not on file    Physically abused: Not on file    Forced sexual activity: Not on file  Other Topics Concern  . Not on file  Social History Narrative  . Not on file     PHYSICAL EXAM:  VS: BP 120/78   Pulse 78   Resp 16   Ht 5' 11.5" (1.816 m)   Wt 271 lb (122.9 kg)   SpO2 98%   BMI 37.27 kg/m  Physical Exam Gen: NAD, alert, cooperative with exam, well-appearing ENT: normal lips, normal nasal mucosa,  Eye: normal EOM, normal conjunctiva and lids CV:  no edema, +2 pedal pulses   Resp: no accessory muscle use, non-labored,  Skin: no rashes, no areas of induration  Neuro: normal tone, normal sensation to touch Psych:  normal insight, alert and oriented MSK:  Left and Right leg:  No tenderness to palpation of the greater trochanter. No tenderness palpation of the lumbar midline spine. No tenderness palpation of the paraspinal lumbar muscles or SI joint. Normal internal and external rotation of the hips. Normal strength resistance with hip flexion, knee flexion and extension, plantarflexion and dorsiflexion. Negative straight leg raise. Equal symmetric +2 reflexes at the patella. Normal flexion extension of the back. Neurovascular intact. Negative straight leg raise     ASSESSMENT & PLAN:   Radiculopathy Pain seems to be associated with radiculopathy.  This would be more consistent with a central lesion if it is  occurring bilaterally.  Less likely for blood clot or peripheral arterial disease.  Uric acid was elevated 8.3 but does not seem to be associated with gout.  Other lab work was nonrevealing. -Initiate gabapentin and Duexis. - Counseled on home exercise therapy and supportive care. -If no improvement would consider imaging or EMG or a muscle relaxer.  Could consider testing ferritin

## 2018-11-19 NOTE — Telephone Encounter (Signed)
[facility] called and said patient is calling back to schedule appointment because leg is feeling worse. I let her know that Dr Jordan Likes is at [facility] today so I could schedule him for Monday or they would have to contact [facility] to get an appointment with him today. She said ok and that she would call us back if he wanted to schedule Monday.

## 2018-11-24 ENCOUNTER — Encounter: Payer: Self-pay | Admitting: Family Medicine

## 2018-12-11 ENCOUNTER — Other Ambulatory Visit: Payer: Self-pay | Admitting: Family Medicine

## 2019-01-04 ENCOUNTER — Encounter: Payer: Self-pay | Admitting: Family Medicine

## 2019-01-05 ENCOUNTER — Other Ambulatory Visit: Payer: Self-pay

## 2019-01-05 ENCOUNTER — Ambulatory Visit (INDEPENDENT_AMBULATORY_CARE_PROVIDER_SITE_OTHER): Payer: BC Managed Care – PPO

## 2019-01-05 ENCOUNTER — Ambulatory Visit: Payer: BC Managed Care – PPO | Admitting: Family Medicine

## 2019-01-05 ENCOUNTER — Encounter: Payer: Self-pay | Admitting: Family Medicine

## 2019-01-05 VITALS — BP 116/84 | HR 79 | Temp 97.3°F | Resp 16 | Ht 71.5 in | Wt 275.0 lb

## 2019-01-05 DIAGNOSIS — R0781 Pleurodynia: Secondary | ICD-10-CM | POA: Insufficient documentation

## 2019-01-05 NOTE — Progress Notes (Signed)
Micheal FabryChristopher Gross - 28 y.o. male MRN [id number]  Date of birth: 1990-09-28  Subjective Chief Complaint  Patient presents with  . Muscle Pain    L pectoral chest pain , goes away at pm sleep, Onset 4wk    HPI Micheal Gross is a 28 y.o. male here today with complaint of pain located along lower left rib area.  He reports that pain started about 4 weeks ago and has been off and on.  Pain is typically worse during the day and tends to subside in the evening.  He works at a computer the majority of the day.  He denies chest tightness, shortness of breath, palpitations or rash.  He has started taking pantoprazole again because he wasn't sure if GERD was contributing.    ROS:  A comprehensive ROS was completed and negative except as noted per HPI  No Known Allergies  No past medical history on file.  No past surgical history on file.  Social History   Socioeconomic History  . Marital status: Single    Spouse name: Not on file  . Number of children: Not on file  . Years of education: Not on file  . Highest education level: Not on file  Occupational History  . Not on file  Social Needs  . Financial resource strain: Not on file  . Food insecurity:    Worry: Not on file    Inability: Not on file  . Transportation needs:    Medical: Not on file    Non-medical: Not on file  Tobacco Use  . Smoking status: Never Smoker  . Smokeless tobacco: Never Used  Substance and Sexual Activity  . Alcohol use: Yes    Comment: occasional  . Drug use: No  . Sexual activity: Not on file  Lifestyle  . Physical activity:    Days per week: Not on file    Minutes per session: Not on file  . Stress: Not on file  Relationships  . Social connections:    Talks on phone: Not on file    Gets together: Not on file    Attends religious service: Not on file    Active member of club or organization: Not on file    Attends meetings of clubs or organizations: Not on file    Relationship status:  Not on file  Other Topics Concern  . Not on file  Social History Narrative  . Not on file    No family history on file.  Health Maintenance  Topic Date Due  . HIV Screening  07/27/2004  . TETANUS/TDAP  07/27/2008  . INFLUENZA VACCINE  02/26/2019    ----------------------------------------------------------------------------------------------------------------------------------------------------------------------------------------------------------------- Physical Exam BP 116/84   Pulse 79   Temp (!) 97.3 F (36.3 C) (Oral)   Resp 16   Ht 5' 11.5" (1.816 m)   Wt 275 lb (124.7 kg)   SpO2 97%   BMI 37.82 kg/m   Physical Exam Constitutional:      Appearance: Normal appearance. He is obese.  HENT:     Head: Normocephalic and atraumatic.     Mouth/Throat:     Mouth: Mucous membranes are moist.  Eyes:     General: No scleral icterus. Neck:     Musculoskeletal: Neck supple.  Cardiovascular:     Rate and Rhythm: Normal rate and regular rhythm.  Pulmonary:     Effort: Pulmonary effort is normal.     Breath sounds: Normal breath sounds.  Chest:     Chest wall:  Tenderness (ttp along lower costal margin on L) present.  Abdominal:     General: Bowel sounds are normal. There is no distension.     Palpations: Abdomen is soft.     Tenderness: There is no abdominal tenderness.  Skin:    General: Skin is warm and dry.     Findings: No rash.  Neurological:     General: No focal deficit present.     Mental Status: He is alert.  Psychiatric:        Mood and Affect: Mood normal.        Behavior: Behavior normal.     ------------------------------------------------------------------------------------------------------------------------------------------------------------------------------------------------------------------- Assessment and Plan  Rib pain on left side -Likely MSK related such as costochondritis.  Possibly postural as it is worse during the day while working.   -CXR ordered given chronicity.  -Recommend heat to area.  May try topical voltaren gel as this is OTC now.  -He will continue protonix for now as well for GERD.

## 2019-01-05 NOTE — Patient Instructions (Addendum)
-Try voltaren gel to area-this is available over the counter.  -Start a vitamin d supplement 2000-4000 units daily -heating pad may be helpful as well.  -We'll be in touch with xray results.  -Continue protonix for now.    Costochondritis  Costochondritis is swelling and irritation (inflammation) of the tissue (cartilage) that connects your ribs to your breastbone (sternum). This causes pain in the front of your chest. The pain usually starts gradually and involves more than one rib. What are the causes? The exact cause of this condition is not always known. It results from stress on the cartilage where your ribs attach to your sternum. The cause of this stress could be:  Chest injury (trauma).  Exercise or activity, such as lifting.  Severe coughing. What increases the risk? You may be at higher risk for this condition if you:  Are male.  Are 2930?30 years old.  Recently started a new exercise or work activity.  Have low levels of vitamin D.  Have a condition that makes you cough frequently. What are the signs or symptoms? The main symptom of this condition is chest pain. The pain:  Usually starts gradually and can be sharp or dull.  Gets worse with deep breathing, coughing, or exercise.  Gets better with rest.  May be worse when you press on the sternum-rib connection (tenderness). How is this diagnosed? This condition is diagnosed based on your symptoms, medical history, and a physical exam. Your health care provider will check for tenderness when pressing on your sternum. This is the most important finding. You may also have tests to rule out other causes of chest pain. These may include:  A chest X-ray to check for lung problems.  An electrocardiogram (ECG) to see if you have a heart problem that could be causing the pain.  An imaging scan to rule out a chest or rib fracture. How is this treated? This condition usually goes away on its own over time. Your health  care provider may prescribe an NSAID to reduce pain and inflammation. Your health care provider may also suggest that you:  Rest and avoid activities that make pain worse.  Apply heat or cold to the area to reduce pain and inflammation.  Do exercises to stretch your chest muscles. If these treatments do not help, your health care provider may inject a numbing medicine at the sternum-rib connection to help relieve the pain. Follow these instructions at home:  Avoid activities that make pain worse. This includes any activities that use chest, abdominal, and side muscles.  If directed, put ice on the painful area: ? Put ice in a plastic bag. ? Place a towel between your skin and the bag. ? Leave the ice on for 20 minutes, 2-3 times a day.  If directed, apply heat to the affected area as often as told by your health care provider. Use the heat source that your health care provider recommends, such as a moist heat pack or a heating pad. ? Place a towel between your skin and the heat source. ? Leave the heat on for 20-30 minutes. ? Remove the heat if your skin turns bright red. This is especially important if you are unable to feel pain, heat, or cold. You may have a greater risk of getting burned.  Take over-the-counter and prescription medicines only as told by your health care provider.  Return to your normal activities as told by your health care provider. Ask your health care provider what activities  are safe for you.  Keep all follow-up visits as told by your health care provider. This is important. Contact a health care provider if:  You have chills or a fever.  Your pain does not go away or it gets worse.  You have a cough that does not go away (is persistent). Get help right away if:  You have shortness of breath. This information is not intended to replace advice given to you by your health care provider. Make sure you discuss any questions you have with your health care  provider. Document Released: 04/23/2005 Document Revised: 04/15/2017 Document Reviewed: 11/07/2015 Elsevier Interactive Patient Education  [employer].

## 2019-01-05 NOTE — Telephone Encounter (Signed)
Questions for Screening COVID-19  Symptom onset: L chest pain - muscle  Travel or Contacts: none  During this illness, did/does the patient experience any of the following symptoms? Fever >100.58F []   Yes [x]   No []   Unknown Subjective fever (felt feverish) []   Yes []   No []   Unknown Chills []   Yes [x]   No []   Unknown Muscle aches (myalgia) []   Yes [x]   No []   Unknown Runny nose (rhinorrhea) []   Yes [x]   No []   Unknown Sore throat []   Yes []   No []   Unknown Cough (new onset or worsening of chronic cough) []   Yes [x]   No []   Unknown Shortness of breath (dyspnea) []   Yes [x]   No []   Unknown Nausea or vomiting []   Yes [x]   No []   Unknown Headache []   Yes []   No [x]   Unknown Abdominal pain  []   Yes [x]   No []   Unknown Diarrhea (?3 loose/looser than normal stools/24hr period) []   Yes [x]   No []   Unknown Other, specify:  Patient risk factors: Smoker? [x]   Current []   Former [x]   Never If male, currently pregnant? []   Yes [x]   No  Patient Active Problem List   Diagnosis Date Noted  . Radiculopathy 11/10/2018  . Arthralgia 08/09/2018  . Well adult exam 07/13/2018  . Dizziness 07/13/2018  . Cough 05/19/2018  . Generalized abdominal pain 09/22/2017    Plan:  []   High risk for COVID-19 with red flags go to ED (with CP, SOB, weak/lightheaded, or fever > 101.5). Call ahead.  []   High risk for COVID-19 but stable. Inform provider and coordinate time for [facility] visit.   [x]   No red flags but URI signs or symptoms okay for [facility] visit.

## 2019-01-05 NOTE — Assessment & Plan Note (Addendum)
-  Likely MSK related such as costochondritis.  Possibly postural as it is worse during the day while working.  -CXR ordered given chronicity.  -Recommend heat to area.  May try topical voltaren gel as this is OTC now.  -He will continue protonix for now as well for GERD.

## 2019-01-06 ENCOUNTER — Encounter: Payer: Self-pay | Admitting: Family Medicine

## 2019-09-26 ENCOUNTER — Telehealth: Payer: Self-pay | Admitting: Adult Health

## 2019-09-26 NOTE — Telephone Encounter (Signed)
Patient left a voicemail stating that he was having new symptoms.  Please call patient

## 2019-09-27 ENCOUNTER — Ambulatory Visit: Payer: BC Managed Care – PPO | Admitting: Neurology

## 2019-09-27 NOTE — Telephone Encounter (Signed)
I reached out to the pt via my chart.

## 2019-09-29 ENCOUNTER — Other Ambulatory Visit: Payer: Self-pay

## 2019-09-29 ENCOUNTER — Ambulatory Visit: Payer: BC Managed Care – PPO | Admitting: Family Medicine

## 2019-09-29 ENCOUNTER — Encounter: Payer: Self-pay | Admitting: Family Medicine

## 2019-09-29 VITALS — BP 120/72 | HR 79 | Temp 97.3°F | Ht 71.5 in | Wt 285.8 lb

## 2019-09-29 DIAGNOSIS — M5412 Radiculopathy, cervical region: Secondary | ICD-10-CM | POA: Diagnosis not present

## 2019-09-29 DIAGNOSIS — R7303 Prediabetes: Secondary | ICD-10-CM

## 2019-09-29 DIAGNOSIS — M25531 Pain in right wrist: Secondary | ICD-10-CM | POA: Diagnosis not present

## 2019-09-29 DIAGNOSIS — M25511 Pain in right shoulder: Secondary | ICD-10-CM

## 2019-09-29 DIAGNOSIS — Z8739 Personal history of other diseases of the musculoskeletal system and connective tissue: Secondary | ICD-10-CM

## 2019-09-29 LAB — GLUCOSE, POCT (MANUAL RESULT ENTRY): POC Glucose: 86 mg/dl (ref 70–99)

## 2019-09-29 LAB — POCT GLYCOSYLATED HEMOGLOBIN (HGB A1C): Hemoglobin A1C: 5.4 % (ref 4.0–5.6)

## 2019-09-29 MED ORDER — PREDNISONE 20 MG PO TABS: ORAL_TABLET | ORAL | 0 refills | Status: DC

## 2019-09-29 MED ORDER — DICLOFENAC SODIUM 75 MG PO TBEC: 75.0000 mg | DELAYED_RELEASE_TABLET | Freq: Two times a day (BID) | ORAL | 1 refills | Status: DC

## 2019-09-29 NOTE — Patient Instructions (Addendum)
Take prednisone 20 mg 3 pills daily for 2 days, then 2 daily for 2 days, then 1 daily for 2 days, then one half daily for 4 days.  This should be taken full dose each morning after breakfast.  It is for the inflammation in the nerve to the arm.  Also take diclofenac 75 mg 1 twice daily with food for pain and inflammation.  I have checked a couple of other blood tests which are pending.  If you keep having symptoms I probably will refer you to a neurologist for evaluation and nerve conduction studies.  Your blood sugar tests are good.   If you have lab work done today you will be contacted with your lab results within the next 2 weeks.  If you have not heard from [place] then please contact us. The fastest way to get your results is to register for My Chart.   IF you received an x-ray today, you will receive an invoice from [facility]. Please contact [facility] at [phone] with questions or concerns regarding your invoice.   IF you received labwork today, you will receive an invoice from [place]. Please contact [facility] at [phone] with questions or concerns regarding your invoice.   Our billing staff will not be able to assist you with questions regarding bills from these companies.  You will be contacted with the lab results as soon as they are available. The fastest way to get your results is to activate your My Chart account. Instructions are located on the last page of this paperwork. If you have not heard from [place] regarding the results in 2 weeks, please contact this office.

## 2019-09-29 NOTE — Progress Notes (Signed)
Patient ID: Micheal Gross, male    DOB: 03/13/1991  Age: 29 y.o. MRN: [id number]  Chief Complaint  Patient presents with  . Shoulder Pain    right shoulder pain for 2-3 weeks and now its been and burning/ tingling feel down to his wrist , and also sometimes his fingers get a little numb in right had. Per patient he thought he was just sleeping on his side too hard because it started out only sore at first but getting worst by the day. He has been taking Aleve , cyclobenzaprine    Subjective:   29 year old male who comes in today with history of having several weeks of right shoulder pain.  Actually starts in the neck and it hurts in the shoulder and upper arm and radiates and numbness down his arm.  There is no history of trauma.  He has been told he had prediabetes and gout in the past.  He has been having some problems with pain in his right little wrist which he attributed to living with a mouse in his hand and so he is wearing a wrist splint currently.  No trauma as noted above.  Has not had problems with his neck or arm in the past.  No history of any motor vehicle accidents.  He works doing Audiological scientist.  Current allergies, medications, problem list, past/family and social histories reviewed.  Objective:  BP 120/72   Pulse 79   Temp (!) 97.3 F (36.3 C) (Temporal)   Ht 5' 11.5" (1.816 m)   Wt 285 lb 12.8 oz (129.6 kg)   SpO2 96%   BMI 39.31 kg/m   Alert and oriented.  No major acute distress.  Neck has full range of motion with no symptoms elicited.  Right shoulder is nontender with good range of motion.  Good strength in his shoulder without pain.  Forearm appears normal.  No atrophy.  Grip is good.  Tinel's sign negative.  Motor strength of thumb fifth finger apposition is normal.  Assessment & Plan:   Assessment: 1. Right cervical radiculopathy   2. Acute pain of right shoulder   3. Right wrist pain   4. Prediabetes   5. History of gout       Plan: I think I will  treat him with some steroids for the cervical radicular symptoms.  However check a blood sugar on him first because of his prediabetes history.  Symptoms are not currently suggestive of his gout history.  If we treat him and he does not improve would then probably get some x-rays of his neck or possibly send him to a neurologist for nerve conduction studies.  Orders Placed This Encounter  Procedures  . Sedimentation Rate  . POCT glucose (manual entry)  . POCT glycosylated hemoglobin (Hb A1C)    Meds ordered this encounter  Medications  . predniSONE (DELTASONE) 20 MG tablet    Sig: Take 3 pills each morning for 2 days, then 2 daily for 2 days, then 1 daily for 2 days then one half daily for 4 days    Dispense:  14 tablet    Refill:  0  . diclofenac (VOLTAREN) 75 MG EC tablet    Sig: Take 1 tablet (75 mg total) by mouth 2 (two) times daily.    Dispense:  30 tablet    Refill:  1         Patient Instructions   Take prednisone 20 mg 3 pills daily for 2 days, then 2 daily  for 2 days, then 1 daily for 2 days, then one half daily for 4 days.  This should be taken full dose each morning after breakfast.  It is for the inflammation in the nerve to the arm.  Also take diclofenac 75 mg 1 twice daily with food for pain and inflammation.  I have checked a couple of other blood tests which are pending.  If you keep having symptoms I probably will refer you to a neurologist for evaluation and nerve conduction studies.    If you have lab work done today you will be contacted with your lab results within the next 2 weeks.  If you have not heard from [place] then please contact us. The fastest way to get your results is to register for My Chart.   IF you received an x-ray today, you will receive an invoice from [facility]. Please contact [facility] at [phone] with questions or concerns regarding your invoice.   IF you received labwork today, you will receive an invoice  from [facility]. Please contact [facility] at [phone] with questions or concerns regarding your invoice.   Our billing staff will not be able to assist you with questions regarding bills from these companies.  You will be contacted with the lab results as soon as they are available. The fastest way to get your results is to activate your My Chart account. Instructions are located on the last page of this paperwork. If you have not heard from [place] regarding the results in 2 weeks, please contact this office.        Return if symptoms worsen or fail to improve.   Ruben Reason, MD 09/29/2019

## 2019-09-30 ENCOUNTER — Encounter: Payer: Self-pay | Admitting: Family Medicine

## 2019-09-30 LAB — SEDIMENTATION RATE: Sed Rate: 21 mm/hr — ABNORMAL HIGH (ref 0–15)

## 2019-10-31 ENCOUNTER — Ambulatory Visit: Payer: BC Managed Care – PPO | Admitting: Registered Nurse

## 2019-10-31 ENCOUNTER — Other Ambulatory Visit: Payer: Self-pay

## 2019-10-31 ENCOUNTER — Encounter: Payer: Self-pay | Admitting: Registered Nurse

## 2019-10-31 VITALS — BP 131/83 | HR 88 | Temp 97.7°F | Resp 16 | Ht 71.5 in | Wt 283.0 lb

## 2019-10-31 DIAGNOSIS — R202 Paresthesia of skin: Secondary | ICD-10-CM

## 2019-10-31 DIAGNOSIS — Z1329 Encounter for screening for other suspected endocrine disorder: Secondary | ICD-10-CM

## 2019-10-31 DIAGNOSIS — Z130 Encounter for screening for diseases of the blood and blood-forming organs and certain disorders involving the immune mechanism: Secondary | ICD-10-CM

## 2019-10-31 DIAGNOSIS — Z13228 Encounter for screening for other metabolic disorders: Secondary | ICD-10-CM

## 2019-10-31 DIAGNOSIS — M79671 Pain in right foot: Secondary | ICD-10-CM

## 2019-10-31 DIAGNOSIS — Z7689 Persons encountering health services in other specified circumstances: Secondary | ICD-10-CM | POA: Diagnosis not present

## 2019-10-31 DIAGNOSIS — Z13220 Encounter for screening for lipoid disorders: Secondary | ICD-10-CM

## 2019-10-31 DIAGNOSIS — M79672 Pain in left foot: Secondary | ICD-10-CM

## 2019-10-31 LAB — URINALYSIS
Bilirubin, UA: NEGATIVE
Glucose, UA: NEGATIVE
Ketones, UA: NEGATIVE
Leukocytes,UA: NEGATIVE
Nitrite, UA: NEGATIVE
Protein,UA: NEGATIVE
RBC, UA: NEGATIVE
Specific Gravity, UA: 1.018 (ref 1.005–1.030)
Urobilinogen, Ur: 0.2 mg/dL (ref 0.2–1.0)
pH, UA: 6 (ref 5.0–7.5)

## 2019-10-31 MED ORDER — MELOXICAM 7.5 MG PO TABS: 7.5000 mg | ORAL_TABLET | Freq: Every day | ORAL | 0 refills | Status: DC

## 2019-10-31 NOTE — Progress Notes (Signed)
Established Patient Office Visit  Subjective:  Patient ID: Micheal Gross, male    DOB: 30-Dec-1990  Age: 29 y.o. MRN: [id number]  CC:  Chief Complaint  Patient presents with  . Transitions Of Care    establish care , also having some lower back pain that comes and goes. also get lab work done     HPI Micheal Gross presents for visit to establish care.   Back pain: lower, midline. No lower extremity symptoms. Worse after sitting long periods. Used to do a lot of heavy lifting through work - works at [employer] now.  Foot pain: bilat, burning, soles of feet. Does not involve ankle or heel. No loss of sensation or change in hair distribution.   Otherwise, requesting routine blood work. No further concerns.   No past medical history on file.  No past surgical history on file.  No family history on file.  Social History   Socioeconomic History  . Marital status: Single    Spouse name: Not on file  . Number of children: Not on file  . Years of education: Not on file  . Highest education level: Not on file  Occupational History  . Not on file  Tobacco Use  . Smoking status: Never Smoker  . Smokeless tobacco: Never Used  Substance and Sexual Activity  . Alcohol use: Yes    Comment: occasional  . Drug use: No  . Sexual activity: Not on file  Other Topics Concern  . Not on file  Social History Narrative  . Not on file   Social Determinants of Health   Financial Resource Strain:   . Difficulty of Paying Living Expenses:   Food Insecurity:   . Worried About Programme researcher, broadcasting/film/video in the Last Year:   . Barista in the Last Year:   Transportation Needs:   . Freight forwarder (Medical):   Marland Kitchen Lack of Transportation (Non-Medical):   Physical Activity:   . Days of Exercise per Week:   . Minutes of Exercise per Session:   Stress:   . Feeling of Stress :   Social Connections:   . Frequency of Communication with Friends and Family:   . Frequency of  Social Gatherings with Friends and Family:   . Attends Religious Services:   . Active Member of Clubs or Organizations:   . Attends Banker Meetings:   Marland Kitchen Marital Status:   Intimate Partner Violence:   . Fear of Current or Ex-Partner:   . Emotionally Abused:   Marland Kitchen Physically Abused:   . Sexually Abused:     Outpatient Medications Prior to Visit  Medication Sig Dispense Refill  . cyclobenzaprine (FLEXERIL) 5 MG tablet     . diclofenac (VOLTAREN) 75 MG EC tablet Take 1 tablet (75 mg total) by mouth 2 (two) times daily. 30 tablet 1  . gabapentin (NEURONTIN) 100 MG capsule TAKE 1 CAPSULE BY MOUTH THREE TIMES A DAY 270 capsule 1  . indomethacin (INDOCIN) 50 MG capsule Take 1 capsule (50 mg total) by mouth 3 (three) times daily as needed for mild pain or moderate pain. (Patient not taking: Reported on 09/29/2019) 90 capsule 0  . predniSONE (DELTASONE) 20 MG tablet Take 3 pills each morning for 2 days, then 2 daily for 2 days, then 1 daily for 2 days then one half daily for 4 days (Patient not taking: Reported on 10/31/2019) 14 tablet 0   No facility-administered medications prior to visit.  No Known Allergies  ROS Review of Systems  Constitutional: Negative.   HENT: Negative.   Eyes: Negative.   Respiratory: Negative.   Cardiovascular: Negative.   Gastrointestinal: Negative.   Endocrine: Negative.   Genitourinary: Negative.   Musculoskeletal: Positive for myalgias. Negative for arthralgias, back pain, gait problem, joint swelling, neck pain and neck stiffness.  Skin: Negative.   Allergic/Immunologic: Negative.   Neurological: Negative.   Hematological: Negative.   Psychiatric/Behavioral: Negative.   All other systems reviewed and are negative.     Objective:    Physical Exam  Constitutional: He is oriented to person, place, and time. He appears well-developed and well-nourished. No distress.  HENT:  Head: Normocephalic and atraumatic.  Cardiovascular: Normal rate  and regular rhythm.  Pulmonary/Chest: Effort normal. No respiratory distress.  Musculoskeletal:        General: No tenderness, deformity or edema. Normal range of motion.  Neurological: He is alert and oriented to person, place, and time.  Skin: Skin is warm and dry. No rash noted. He is not diaphoretic. No erythema. No pallor.  Psychiatric: He has a normal mood and affect. His behavior is normal. Judgment and thought content normal.  Nursing note and vitals reviewed.   BP 131/83   Pulse 88   Temp 97.7 F (36.5 C) (Temporal)   Resp 16   Ht 5' 11.5" (1.816 m)   Wt 283 lb (128.4 kg)   SpO2 94%   BMI 38.92 kg/m  Wt Readings from Last 3 Encounters:  10/31/19 283 lb (128.4 kg)  09/29/19 285 lb 12.8 oz (129.6 kg)  01/05/19 275 lb (124.7 kg)     There are no preventive care reminders to display for this patient.  There are no preventive care reminders to display for this patient.  Lab Results  Component Value Date   TSH 1.12 07/13/2018   Lab Results  Component Value Date   WBC 8.2 07/13/2018   HGB 15.3 07/13/2018   HCT 46.6 07/13/2018   MCV 86.1 07/13/2018   PLT 310.0 07/13/2018   Lab Results  Component Value Date   NA 138 07/13/2018   K 4.5 07/13/2018   CO2 28 07/13/2018   GLUCOSE 91 07/13/2018   BUN 11 07/13/2018   CREATININE 1.07 07/13/2018   BILITOT 0.6 07/13/2018   ALKPHOS 63 07/13/2018   AST 16 07/13/2018   ALT 14 07/13/2018   PROT 7.4 07/13/2018   ALBUMIN 4.6 07/13/2018   CALCIUM 9.9 07/13/2018   GFR 105.11 07/13/2018   Lab Results  Component Value Date   CHOL 187 07/13/2018   Lab Results  Component Value Date   HDL 37.60 (L) 07/13/2018   Lab Results  Component Value Date   LDLCALC 125 (H) 07/13/2018   Lab Results  Component Value Date   TRIG 124.0 07/13/2018   Lab Results  Component Value Date   CHOLHDL 5 07/13/2018   Lab Results  Component Value Date   HGBA1C 5.4 09/29/2019      Assessment & Plan:   Problem List Items Addressed  This Visit    None    Visit Diagnoses    Screening for endocrine, metabolic and immunity disorder    -  Primary   Relevant Orders   TSH   Hemoglobin A1c   CBC with Differential   Comprehensive metabolic panel   Urinalysis   Lipid screening       Relevant Orders   Lipid panel   Encounter to establish care  Tingling in extremities       Relevant Orders   Vitamin B12   Vitamin D, 25-hydroxy   Magnesium   Foot pain, bilateral       Relevant Medications   meloxicam (MOBIC) 7.5 MG tablet      Meds ordered this encounter  Medications  . meloxicam (MOBIC) 7.5 MG tablet    Sig: Take 1 tablet (7.5 mg total) by mouth daily.    Dispense:  30 tablet    Refill:  0    Order Specific Question:   Supervising Provider    Answer:   Forrest Moron [id number]    Follow-up: No follow-ups on file.   PLAN  Pes planus vs. Plantar fasciitis. Reports he has been running a lot in basketball shoes - suggest that he get running shoes and wear them frequently, massage bottoms of feet. Circulation and sensation intact bilat.  Lower back pain: likely from much lifting when younger worsened by tight lumbar musculature now - suggest stretching, resting.  Labs collected, will follow up as warranted  Patient encouraged to call clinic with any questions, comments, or concerns.  Maximiano Coss, NP

## 2019-10-31 NOTE — Patient Instructions (Signed)
° ° ° °  If you have lab work done today you will be contacted with your lab results within the next 2 weeks.  If you have not heard from us then please contact us. The fastest way to get your results is to register for My Chart. ° ° °IF you received an x-ray today, you will receive an invoice from [facility]. Please contact [facility] at [phone] with questions or concerns regarding your invoice.  ° °IF you received labwork today, you will receive an invoice from [facility]. Please contact [facility] at [phone] with questions or concerns regarding your invoice.  ° °Our billing staff will not be able to assist you with questions regarding bills from these companies. ° °You will be contacted with the lab results as soon as they are available. The fastest way to get your results is to activate your My Chart account. Instructions are located on the last page of this paperwork. If you have not heard from us regarding the results in 2 weeks, please contact this office. °  ° ° ° °

## 2019-11-01 LAB — TSH: TSH: 0.755 u[IU]/mL (ref 0.450–4.500)

## 2019-11-01 LAB — COMPREHENSIVE METABOLIC PANEL
ALT: 14 IU/L (ref 0–44)
AST: 17 IU/L (ref 0–40)
Albumin/Globulin Ratio: 1.5 (ref 1.2–2.2)
Albumin: 4.3 g/dL (ref 4.1–5.2)
Alkaline Phosphatase: 67 IU/L (ref 39–117)
BUN/Creatinine Ratio: 11 (ref 9–20)
BUN: 12 mg/dL (ref 6–20)
Bilirubin Total: 0.3 mg/dL (ref 0.0–1.2)
CO2: 23 mmol/L (ref 20–29)
Calcium: 9.9 mg/dL (ref 8.7–10.2)
Chloride: 104 mmol/L (ref 96–106)
Creatinine, Ser: 1.07 mg/dL (ref 0.76–1.27)
GFR calc Af Amer: 107 mL/min/{1.73_m2} (ref >59)
GFR calc non Af Amer: 93 mL/min/{1.73_m2} (ref >59)
Globulin, Total: 2.8 g/dL (ref 1.5–4.5)
Glucose: 85 mg/dL (ref 65–99)
Potassium: 4.5 mmol/L (ref 3.5–5.2)
Sodium: 144 mmol/L (ref 134–144)
Total Protein: 7.1 g/dL (ref 6.0–8.5)

## 2019-11-01 LAB — CBC WITH DIFFERENTIAL/PLATELET
Basophils Absolute: 0.1 10*3/uL (ref 0.0–0.2)
Basos: 1 %
EOS (ABSOLUTE): 0.3 10*3/uL (ref 0.0–0.4)
Eos: 4 %
Hematocrit: 47.3 % (ref 37.5–51.0)
Hemoglobin: 15.8 g/dL (ref 13.0–17.7)
Immature Grans (Abs): 0 10*3/uL (ref 0.0–0.1)
Immature Granulocytes: 0 %
Lymphocytes Absolute: 2.3 10*3/uL (ref 0.7–3.1)
Lymphs: 33 %
MCH: 29.5 pg (ref 26.6–33.0)
MCHC: 33.4 g/dL (ref 31.5–35.7)
MCV: 88 fL (ref 79–97)
Monocytes Absolute: 0.5 10*3/uL (ref 0.1–0.9)
Monocytes: 6 %
Neutrophils Absolute: 4 10*3/uL (ref 1.4–7.0)
Neutrophils: 56 %
Platelets: 273 10*3/uL (ref 150–450)
RBC: 5.36 x10E6/uL (ref 4.14–5.80)
RDW: 13.2 % (ref 11.6–15.4)
WBC: 7.2 10*3/uL (ref 3.4–10.8)

## 2019-11-01 LAB — LIPID PANEL
Chol/HDL Ratio: 5.3 ratio — ABNORMAL HIGH (ref 0.0–5.0)
Cholesterol, Total: 203 mg/dL — ABNORMAL HIGH (ref 100–199)
HDL: 38 mg/dL — ABNORMAL LOW (ref >39)
LDL Chol Calc (NIH): 137 mg/dL — ABNORMAL HIGH (ref 0–99)
Triglycerides: 153 mg/dL — ABNORMAL HIGH (ref 0–149)
VLDL Cholesterol Cal: 28 mg/dL (ref 5–40)

## 2019-11-01 LAB — HEMOGLOBIN A1C
Est. average glucose Bld gHb Est-mCnc: 117 mg/dL
Hgb A1c MFr Bld: 5.7 % — ABNORMAL HIGH (ref 4.8–5.6)

## 2019-11-01 LAB — VITAMIN B12: Vitamin B-12: 293 pg/mL (ref 232–1245)

## 2019-11-01 LAB — VITAMIN D 25 HYDROXY (VIT D DEFICIENCY, FRACTURES): Vit D, 25-Hydroxy: 13.1 ng/mL — ABNORMAL LOW (ref 30.0–100.0)

## 2019-11-01 LAB — MAGNESIUM: Magnesium: 2.1 mg/dL (ref 1.6–2.3)

## 2019-11-09 ENCOUNTER — Encounter: Payer: Self-pay | Admitting: Registered Nurse

## 2019-11-09 DIAGNOSIS — E559 Vitamin D deficiency, unspecified: Secondary | ICD-10-CM

## 2019-11-09 MED ORDER — VITAMIN D (ERGOCALCIFEROL) 1.25 MG (50000 UNIT) PO CAPS: 50000.0000 [IU] | ORAL_CAPSULE | ORAL | 0 refills | Status: DC

## 2019-11-09 NOTE — Progress Notes (Signed)
Labs returned Low vitamin d Start 01100 IU supplement once weekly for 8 weeks Return for follow up lab after that  Letter sent via MyChart  Jari Sportsman, NP

## 2019-11-10 NOTE — Telephone Encounter (Signed)
Pt would like to know if he could supplement naturally with more time in the sun, different foods? Thoughts?

## 2019-12-04 ENCOUNTER — Other Ambulatory Visit: Payer: Self-pay | Admitting: Registered Nurse

## 2019-12-04 DIAGNOSIS — M79671 Pain in right foot: Secondary | ICD-10-CM

## 2019-12-05 NOTE — Telephone Encounter (Signed)
Patient is requesting a refill of the following medications: Requested Prescriptions   Pending Prescriptions Disp Refills  . meloxicam (MOBIC) 7.5 MG tablet [Pharmacy Med Name: MELOXICAM 7.5 MG TABLET] 30 tablet 0    Sig: TAKE 1 TABLET BY MOUTH EVERY DAY    Date of patient request: 12/04/2019 Last office visit: 10/31/2019 Date of last refill: 10/31/2019 Last refill amount: 30 tablets  Follow up time period per chart: N/A

## 2019-12-30 ENCOUNTER — Other Ambulatory Visit: Payer: Self-pay | Admitting: Registered Nurse

## 2019-12-30 DIAGNOSIS — E559 Vitamin D deficiency, unspecified: Secondary | ICD-10-CM

## 2020-01-03 ENCOUNTER — Other Ambulatory Visit: Payer: Self-pay

## 2020-01-03 ENCOUNTER — Ambulatory Visit (INDEPENDENT_AMBULATORY_CARE_PROVIDER_SITE_OTHER): Payer: BC Managed Care – PPO

## 2020-01-03 ENCOUNTER — Ambulatory Visit (HOSPITAL_COMMUNITY)
Admission: EM | Admit: 2020-01-03 | Discharge: 2020-01-03 | Disposition: A | Discharge status: Home / Self Care | Payer: BC Managed Care – PPO | Attending: Family Medicine | Admitting: Family Medicine

## 2020-01-03 ENCOUNTER — Encounter (HOSPITAL_COMMUNITY): Payer: Self-pay

## 2020-01-03 DIAGNOSIS — R0789 Other chest pain: Secondary | ICD-10-CM | POA: Diagnosis not present

## 2020-01-03 HISTORY — DX: Prediabetes: R73.03

## 2020-01-03 HISTORY — DX: Hyperlipidemia, unspecified: E78.5

## 2020-01-03 MED ORDER — LIDOCAINE VISCOUS HCL 2 % MT SOLN
OROMUCOSAL | Status: AC
  Filled 2020-01-03: qty 15

## 2020-01-03 MED ORDER — ALUM & MAG HYDROXIDE-SIMETH 200-200-20 MG/5ML PO SUSP
ORAL | Status: AC
  Filled 2020-01-03: qty 30

## 2020-01-03 MED ORDER — LIDOCAINE VISCOUS HCL 2 % MT SOLN
15.0000 mL | Freq: Once | OROMUCOSAL | Status: AC
  Administered 2020-01-03: 15 mL via ORAL

## 2020-01-03 MED ORDER — NAPROXEN 500 MG PO TABS
500.0000 mg | ORAL_TABLET | Freq: Two times a day (BID) | ORAL | 0 refills | Status: DC
Start: 2020-01-03 — End: 2021-01-03

## 2020-01-03 MED ORDER — ALUM & MAG HYDROXIDE-SIMETH 200-200-20 MG/5ML PO SUSP
30.0000 mL | Freq: Once | ORAL | Status: AC
  Administered 2020-01-03: 30 mL via ORAL

## 2020-01-03 MED ORDER — OMEPRAZOLE 20 MG PO CPDR
20.0000 mg | DELAYED_RELEASE_CAPSULE | Freq: Two times a day (BID) | ORAL | 0 refills | Status: DC
Start: 2020-01-03 — End: 2021-01-03

## 2020-01-03 NOTE — Discharge Instructions (Signed)
EKG normal, Chest Xray normal Begin trial of omeprazole twice daily for the next 2 weeks Please also use Naprosyn twice daily with food as needed for discomfort Please follow up if any symptoms are improving or worsening

## 2020-01-03 NOTE — ED Provider Notes (Signed)
[facility]    CSN: [id number] Arrival date & time: 01/03/20  1355      History   Chief Complaint Chief Complaint  Patient presents with   Chest Pain    HPI Markice Torbert is a 29 y.o. male presenting today for evaluation of left-sided chest discomfort.  Patient reports over the past 3 weeks he has had a pressure/fullness sensation to his left lower chest/breast area.  Of recently symptoms have worsened.  Feels discomfort with sitting and hunching over, feels better with standing upright.  Also feels worsening after eating especially with fried foods.  Does report history of GERD, denies associated nausea or vomiting, denies associated abdominal pain, sore throat or sour taste in mouth.  Has previously been on PPIs, but is not currently on medicine.  He also reports a slight associated cough and has had some mild URI symptoms over the past week of recently.  Feels slightly short of breath.  Denies history of hypertension.  Reports prediabetic.  Denies history of tobacco use or smoking.  Denies leg pain or leg swelling.  Denies prior DVT/PE.  Has used some aspirin for symptoms without relief.   HPI  Past Medical History:  Diagnosis Date   Hyperlipidemia    Prediabetes     Patient Active Problem List   Diagnosis Date Noted   Rib pain on left side 01/05/2019   Radiculopathy 11/10/2018   Arthralgia 08/09/2018   Well adult exam 07/13/2018   Dizziness 07/13/2018   Cough 05/19/2018   Generalized abdominal pain 09/22/2017    Past Surgical History:  Procedure Laterality Date   NO PAST SURGERIES         Home Medications    Prior to Admission medications   Medication Sig Start Date End Date Taking? Authorizing Provider  cyclobenzaprine (FLEXERIL) 5 MG tablet  11/01/18   [provider]  diclofenac (VOLTAREN) 75 MG EC tablet Take 1 tablet (75 mg total) by mouth 2 (two) times daily. 09/29/19   Peyton Najjar, MD  gabapentin (NEURONTIN) 100 MG  capsule TAKE 1 CAPSULE BY MOUTH THREE TIMES A DAY 12/13/18   Myra Rude, MD  indomethacin (INDOCIN) 50 MG capsule Take 1 capsule (50 mg total) by mouth 3 (three) times daily as needed for mild pain or moderate pain. Patient not taking: Reported on 09/29/2019 08/11/18   Everrett Coombe, DO  meloxicam (MOBIC) 7.5 MG tablet TAKE 1 TABLET BY MOUTH EVERY DAY 12/05/19   Janeece Agee, NP  naproxen (NAPROSYN) 500 MG tablet Take 1 tablet (500 mg total) by mouth 2 (two) times daily. 01/03/20   Lot Medford C, PA-C  omeprazole (PRILOSEC) 20 MG capsule Take 1 capsule (20 mg total) by mouth 2 (two) times daily before a meal for 14 days. 01/03/20 01/17/20  Kandra Graven C, PA-C  predniSONE (DELTASONE) 20 MG tablet Take 3 pills each morning for 2 days, then 2 daily for 2 days, then 1 daily for 2 days then one half daily for 4 days Patient not taking: Reported on 10/31/2019 09/29/19   Peyton Najjar, MD  Vitamin D, Ergocalciferol, (DRISDOL) 1.25 MG (50000 UNIT) CAPS capsule TAKE 1 CAPSULE (50,000 UNITS TOTAL) BY MOUTH EVERY 7 (SEVEN) DAYS. 12/30/19   Janeece Agee, NP    Family History No family history on file.  Social History Social History   Tobacco Use   Smoking status: Never Smoker   Smokeless tobacco: Never Used  Substance Use Topics   Alcohol use: Yes  Comment: occasional   Drug use: No     Allergies   Patient has no known allergies.   Review of Systems Review of Systems  Constitutional: Negative for activity change, appetite change, chills, fatigue and fever.  HENT: Negative for congestion, ear pain, rhinorrhea, sinus pressure, sore throat and trouble swallowing.   Eyes: Negative for discharge and redness.  Respiratory: Negative for cough, chest tightness and shortness of breath.   Cardiovascular: Positive for chest pain.  Gastrointestinal: Negative for abdominal pain, constipation, diarrhea, nausea and vomiting.  Musculoskeletal: Negative for myalgias.  Skin: Negative for  rash.  Neurological: Negative for dizziness, light-headedness and headaches.     Physical Exam Triage Vital Signs ED Triage Vitals  Enc Vitals Group     BP 01/03/20 1443 123/61     Pulse Rate 01/03/20 1443 71     Resp 01/03/20 1443 17     Temp 01/03/20 1443 98.8 F (37.1 C)     Temp Source 01/03/20 1443 Oral     SpO2 01/03/20 1443 97 %     Weight --      Height --      Head Circumference --      Peak Flow --      Pain Score 01/03/20 1441 6     Pain Loc --      Pain Edu? --      Excl. in GC? --    No data found.  Updated Vital Signs BP 123/61 (BP Location: Right Arm)    Pulse 71    Temp 98.8 F (37.1 C) (Oral)    Resp 17    SpO2 97%   Visual Acuity Right Eye Distance:   Left Eye Distance:   Bilateral Distance:    Right Eye Near:   Left Eye Near:    Bilateral Near:     Physical Exam Vitals and nursing note reviewed.  Constitutional:      Appearance: He is well-developed.     Comments: No acute distress  HENT:     Head: Normocephalic and atraumatic.     Ears:     Comments: Bilateral ears without tenderness to palpation of external auricle, tragus and mastoid, EAC's without erythema or swelling, TM's with good bony landmarks and cone of light. Non erythematous.    Nose: Nose normal.     Mouth/Throat:     Comments: Oral mucosa pink and moist, no tonsillar enlargement or exudate. Posterior pharynx patent and nonerythematous, no uvula deviation or swelling. Normal phonation. Eyes:     Extraocular Movements: Extraocular movements intact.     Conjunctiva/sclera: Conjunctivae normal.     Pupils: Pupils are equal, round, and reactive to light.     Comments: Wearing glasses  Cardiovascular:     Rate and Rhythm: Normal rate.  Pulmonary:     Effort: Pulmonary effort is normal. No respiratory distress.     Comments: Breathing comfortably at rest, CTABL, no wheezing, rales or other adventitious sounds auscultated Abdominal:     General: There is no distension.      Comments: Soft, nondistended, nontender to light and deep palpation throughout entire abdomen  Musculoskeletal:        General: Normal range of motion.     Cervical back: Neck supple.     Comments: No reproducible tenderness to palpation over left anterior chest throughout rib cage  Tenderness to palpation to left lower rib cage at mid axillary line extending slightly towards thoracic region of back  Bilateral lower  legs symmetric  Skin:    General: Skin is warm and dry.  Neurological:     Mental Status: He is alert and oriented to person, place, and time.      UC Treatments / Results  Labs (all labs ordered are listed, but only abnormal results are displayed) Labs Reviewed - No data to display  EKG   Radiology DG Chest 2 View  Result Date: 01/03/2020 CLINICAL DATA:  Left chest pain and pressure, shortness of breath, dry cough and dizziness for 3 weeks. EXAM: CHEST - 2 VIEW COMPARISON:  PA and lateral chest 01/05/2019. FINDINGS: Lungs are clear. Heart size is normal. No pneumothorax or pleural fluid. No acute or focal bony abnormality. IMPRESSION: Negative chest. Electronically Signed   By: Inge Rise M.D.   On: 01/03/2020 16:07    Procedures Procedures (including critical care time)  Medications Ordered in UC Medications  alum & mag hydroxide-simeth (MAALOX/MYLANTA) 200-200-20 MG/5ML suspension 30 mL (30 mLs Oral Given 01/03/20 1534)    And  lidocaine (XYLOCAINE) 2 % viscous mouth solution 15 mL (15 mLs Oral Given 01/03/20 1534)    Initial Impression / Assessment and Plan / UC Course  I have reviewed the triage vital signs and the nursing notes.  Pertinent labs & imaging results that were available during my care of the patient were reviewed by me and considered in my medical decision making (see chart for details).     EKG normal sinus rhythm, no acute signs of ischemia or infarction, stable from prior EKG.  Chest x-ray negative.  Very slight improvement of symptoms  with GI cocktail.  Placing on 2-week trial of PPI along with using Naprosyn as needed for possible chest wall inflammation.  Close monitoring of symptoms, low suspicion of DVT/PE at this time.  Discussed strict return precautions. Patient verbalized understanding and is agreeable with plan.  Final Clinical Impressions(s) / UC Diagnoses   Final diagnoses:  Atypical chest pain     Discharge Instructions     EKG normal, Chest Xray normal Begin trial of omeprazole twice daily for the next 2 weeks Please also use Naprosyn twice daily with food as needed for discomfort Please follow up if any symptoms are improving or worsening    ED Prescriptions    Medication Sig Dispense Auth. Provider   omeprazole (PRILOSEC) 20 MG capsule Take 1 capsule (20 mg total) by mouth 2 (two) times daily before a meal for 14 days. 28 capsule Darcy Cordner C, PA-C   naproxen (NAPROSYN) 500 MG tablet Take 1 tablet (500 mg total) by mouth 2 (two) times daily. 30 tablet Prentice Sackrider, [place] C, PA-C     PDMP not reviewed this encounter.   Janith Lima, [place] 01/03/20 1641

## 2020-01-03 NOTE — ED Triage Notes (Signed)
C/o chest pain under left breast area. Causes him to have shortness of breath occasionally. Reports that it started three weeks ago, but has intensified in the last week.

## 2020-01-04 ENCOUNTER — Encounter: Payer: Self-pay | Admitting: Registered Nurse

## 2020-01-04 ENCOUNTER — Other Ambulatory Visit: Payer: Self-pay | Admitting: Registered Nurse

## 2020-01-04 ENCOUNTER — Ambulatory Visit: Payer: BC Managed Care – PPO | Admitting: Registered Nurse

## 2020-01-04 DIAGNOSIS — E559 Vitamin D deficiency, unspecified: Secondary | ICD-10-CM

## 2020-01-04 NOTE — Telephone Encounter (Signed)
Pt wants to know if he should continue the high dose Vit D as the pharmacy had accidentally filled it while he was picking up other prescriptions

## 2020-02-15 ENCOUNTER — Ambulatory Visit: Payer: BC Managed Care – PPO | Admitting: Registered Nurse

## 2020-02-17 ENCOUNTER — Ambulatory Visit: Payer: BC Managed Care – PPO | Admitting: Registered Nurse

## 2020-02-20 ENCOUNTER — Encounter: Payer: Self-pay | Admitting: Registered Nurse

## 2020-02-20 ENCOUNTER — Other Ambulatory Visit: Payer: Self-pay | Admitting: Registered Nurse

## 2020-02-20 ENCOUNTER — Ambulatory Visit: Payer: BC Managed Care – PPO | Admitting: Registered Nurse

## 2020-02-20 ENCOUNTER — Other Ambulatory Visit: Payer: Self-pay

## 2020-02-20 VITALS — BP 121/80 | HR 72 | Temp 98.3°F | Resp 18 | Ht 71.5 in | Wt 276.6 lb

## 2020-02-20 DIAGNOSIS — E559 Vitamin D deficiency, unspecified: Secondary | ICD-10-CM

## 2020-02-20 DIAGNOSIS — R195 Other fecal abnormalities: Secondary | ICD-10-CM | POA: Diagnosis not present

## 2020-02-20 DIAGNOSIS — K219 Gastro-esophageal reflux disease without esophagitis: Secondary | ICD-10-CM

## 2020-02-20 MED ORDER — SUCRALFATE 1 G PO TABS: 1.0000 g | ORAL_TABLET | Freq: Three times a day (TID) | ORAL | 0 refills | Status: DC

## 2020-02-20 MED ORDER — PANTOPRAZOLE SODIUM 40 MG PO TBEC: 40.0000 mg | DELAYED_RELEASE_TABLET | Freq: Every day | ORAL | 3 refills | Status: DC

## 2020-02-20 NOTE — Patient Instructions (Signed)
° ° ° °  If you have lab work done today you will be contacted with your lab results within the next 2 weeks.  If you have not heard from us then please contact us. The fastest way to get your results is to register for My Chart. ° ° °IF you received an x-ray today, you will receive an invoice from [facility]. Please contact [facility] at [phone] with questions or concerns regarding your invoice.  ° °IF you received labwork today, you will receive an invoice from [facility]. Please contact [facility] at [phone] with questions or concerns regarding your invoice.  ° °Our billing staff will not be able to assist you with questions regarding bills from these companies. ° °You will be contacted with the lab results as soon as they are available. The fastest way to get your results is to activate your My Chart account. Instructions are located on the last page of this paperwork. If you have not heard from us regarding the results in 2 weeks, please contact this office. °  ° ° ° °

## 2020-02-21 LAB — CBC WITH DIFFERENTIAL
Basophils Absolute: 0.1 10*3/uL (ref 0.0–0.2)
Basos: 1 %
EOS (ABSOLUTE): 0.3 10*3/uL (ref 0.0–0.4)
Eos: 4 %
Hematocrit: 45.2 % (ref 37.5–51.0)
Hemoglobin: 14.6 g/dL (ref 13.0–17.7)
Immature Grans (Abs): 0 10*3/uL (ref 0.0–0.1)
Immature Granulocytes: 0 %
Lymphocytes Absolute: 2.7 10*3/uL (ref 0.7–3.1)
Lymphs: 39 %
MCH: 28.2 pg (ref 26.6–33.0)
MCHC: 32.3 g/dL (ref 31.5–35.7)
MCV: 87 fL (ref 79–97)
Monocytes Absolute: 0.3 10*3/uL (ref 0.1–0.9)
Monocytes: 5 %
Neutrophils Absolute: 3.5 10*3/uL (ref 1.4–7.0)
Neutrophils: 51 %
RBC: 5.17 x10E6/uL (ref 4.14–5.80)
RDW: 13.4 % (ref 11.6–15.4)
WBC: 7 10*3/uL (ref 3.4–10.8)

## 2020-02-21 LAB — VITAMIN D 25 HYDROXY (VIT D DEFICIENCY, FRACTURES): Vit D, 25-Hydroxy: 21.1 ng/mL — ABNORMAL LOW (ref 30.0–100.0)

## 2020-04-06 ENCOUNTER — Encounter: Payer: Self-pay | Admitting: Registered Nurse

## 2020-04-06 ENCOUNTER — Ambulatory Visit: Payer: BC Managed Care – PPO | Admitting: Registered Nurse

## 2020-04-06 ENCOUNTER — Other Ambulatory Visit: Payer: Self-pay

## 2020-04-06 VITALS — BP 130/80 | HR 79 | Temp 97.8°F | Resp 18 | Ht 71.52 in | Wt 276.2 lb

## 2020-04-06 DIAGNOSIS — M62830 Muscle spasm of back: Secondary | ICD-10-CM | POA: Diagnosis not present

## 2020-04-06 DIAGNOSIS — R0981 Nasal congestion: Secondary | ICD-10-CM

## 2020-04-06 DIAGNOSIS — K219 Gastro-esophageal reflux disease without esophagitis: Secondary | ICD-10-CM

## 2020-04-06 MED ORDER — FLUTICASONE PROPIONATE 50 MCG/ACT NA SUSP: 2.0000 | Freq: Every day | NASAL | 6 refills | Status: DC

## 2020-04-06 MED ORDER — CETIRIZINE HCL 10 MG PO TABS: 10.0000 mg | ORAL_TABLET | Freq: Every day | ORAL | 11 refills | Status: DC

## 2020-04-06 MED ORDER — METHOCARBAMOL 500 MG PO TABS: 500.0000 mg | ORAL_TABLET | Freq: Three times a day (TID) | ORAL | 0 refills | Status: DC | PRN

## 2020-04-06 NOTE — Patient Instructions (Signed)
° ° ° °  If you have lab work done today you will be contacted with your lab results within the next 2 weeks.  If you have not heard from us then please contact us. The fastest way to get your results is to register for My Chart. ° ° °IF you received an x-ray today, you will receive an invoice from [facility]. Please contact [facility] at [phone] with questions or concerns regarding your invoice.  ° °IF you received labwork today, you will receive an invoice from [facility]. Please contact [facility] at [phone] with questions or concerns regarding your invoice.  ° °Our billing staff will not be able to assist you with questions regarding bills from these companies. ° °You will be contacted with the lab results as soon as they are available. The fastest way to get your results is to activate your My Chart account. Instructions are located on the last page of this paperwork. If you have not heard from us regarding the results in 2 weeks, please contact this office. °  ° ° ° °

## 2020-04-06 NOTE — Progress Notes (Signed)
Established Patient Office Visit  Subjective:  Patient ID: Micheal Gross, male    DOB: 1990/11/08  Age: 29 y.o. MRN: [id number]  CC:  Chief Complaint  Patient presents with  . Ear Pain    Patient states he is having some right ear pain and ringing w/ some nasal congestion. patient would like to discuss ongoing stomach pain.    HPI Micheal Gross presents for ear pain, back pain, and abdominal pain  Ear pain: R ear mostly, some L ear, some nasal congestion, some sinus pressure. Ongoing and steady for 2 months or so. Has not tried treatment. Does note some loss of hearing / muffled hearing in R ear. No drainage. No cough or cold. No sick contacts. No systemic symptoms.  Back pain: has been working out more lately. Having some occ muscle spasm in lower back. No urinary symptoms. No radiation into legs. No saddle symptoms. Full ROM.  Abdominal pain: ongoing epigastric pain. Was relieved somewhat by sucralfate. Pantoprazole was somewhat effective for a short time. No longer having dark stools. No lightheadedness, shob doe, or other signs of anemia.  Past Medical History:  Diagnosis Date  . Hyperlipidemia   . Prediabetes     Past Surgical History:  Procedure Laterality Date  . NO PAST SURGERIES      No family history on file.  Social History   Socioeconomic History  . Marital status: Significant Other    Spouse name: Not on file  . Number of children: Not on file  . Years of education: Not on file  . Highest education level: Not on file  Occupational History  . Not on file  Tobacco Use  . Smoking status: Never Smoker  . Smokeless tobacco: Never Used  Vaping Use  . Vaping Use: Never used  Substance and Sexual Activity  . Alcohol use: Yes    Comment: occasional  . Drug use: No  . Sexual activity: Not on file  Other Topics Concern  . Not on file  Social History Narrative  . Not on file   Social Determinants of Health   Financial Resource Strain:   .  Difficulty of Paying Living Expenses: Not on file  Food Insecurity:   . Worried About Programme researcher, broadcasting/film/video in the Last Year: Not on file  . Ran Out of Food in the Last Year: Not on file  Transportation Needs:   . Lack of Transportation (Medical): Not on file  . Lack of Transportation (Non-Medical): Not on file  Physical Activity:   . Days of Exercise per Week: Not on file  . Minutes of Exercise per Session: Not on file  Stress:   . Feeling of Stress : Not on file  Social Connections:   . Frequency of Communication with Friends and Family: Not on file  . Frequency of Social Gatherings with Friends and Family: Not on file  . Attends Religious Services: Not on file  . Active Member of Clubs or Organizations: Not on file  . Attends Banker Meetings: Not on file  . Marital Status: Not on file  Intimate Partner Violence:   . Fear of Current or Ex-Partner: Not on file  . Emotionally Abused: Not on file  . Physically Abused: Not on file  . Sexually Abused: Not on file    Outpatient Medications Prior to Visit  Medication Sig Dispense Refill  . cyclobenzaprine (FLEXERIL) 5 MG tablet     . diclofenac (VOLTAREN) 75 MG EC tablet Take 1  tablet (75 mg total) by mouth 2 (two) times daily. 30 tablet 1  . gabapentin (NEURONTIN) 100 MG capsule TAKE 1 CAPSULE BY MOUTH THREE TIMES A DAY 270 capsule 1  . meloxicam (MOBIC) 7.5 MG tablet TAKE 1 TABLET BY MOUTH EVERY DAY 30 tablet 0  . naproxen (NAPROSYN) 500 MG tablet Take 1 tablet (500 mg total) by mouth 2 (two) times daily. 30 tablet 0  . pantoprazole (PROTONIX) 40 MG tablet Take 1 tablet (40 mg total) by mouth daily. 30 tablet 3  . sucralfate (CARAFATE) 1 g tablet Take 1 tablet (1 g total) by mouth 4 (four) times daily -  with meals and at bedtime. 40 tablet 0  . Vitamin D, Ergocalciferol, (DRISDOL) 1.25 MG (50000 UNIT) CAPS capsule TAKE 1 CAPSULE (50,000 UNITS TOTAL) BY MOUTH EVERY 7 (SEVEN) DAYS. 8 capsule 0  . indomethacin (INDOCIN) 50  MG capsule Take 1 capsule (50 mg total) by mouth 3 (three) times daily as needed for mild pain or moderate pain. (Patient not taking: Reported on 09/29/2019) 90 capsule 0  . omeprazole (PRILOSEC) 20 MG capsule Take 1 capsule (20 mg total) by mouth 2 (two) times daily before a meal for 14 days. 28 capsule 0  . predniSONE (DELTASONE) 20 MG tablet Take 3 pills each morning for 2 days, then 2 daily for 2 days, then 1 daily for 2 days then one half daily for 4 days (Patient not taking: Reported on 10/31/2019) 14 tablet 0   No facility-administered medications prior to visit.    No Known Allergies  ROS Review of Systems  Constitutional: Negative.   HENT: Positive for ear pain and sinus pressure. Negative for ear discharge.   Eyes: Negative.   Respiratory: Negative.   Cardiovascular: Negative.   Gastrointestinal: Positive for abdominal pain. Negative for constipation, diarrhea, nausea, rectal pain and vomiting.  Endocrine: Negative.   Genitourinary: Negative.   Musculoskeletal: Negative.   Skin: Negative.   Allergic/Immunologic: Negative.   Neurological: Negative.   Hematological: Negative.   Psychiatric/Behavioral: Negative.       Objective:    Physical Exam Constitutional:      General: He is not in acute distress.    Appearance: Normal appearance. He is normal weight. He is not ill-appearing, toxic-appearing or diaphoretic.  HENT:     Right Ear: Ear canal and external ear normal. Decreased hearing noted. A middle ear effusion is present.     Left Ear: Hearing, ear canal and external ear normal. No decreased hearing noted. A middle ear effusion is present.  Cardiovascular:     Rate and Rhythm: Normal rate and regular rhythm.     Heart sounds: Normal heart sounds. No murmur heard.  No friction rub. No gallop.   Pulmonary:     Effort: Pulmonary effort is normal. No respiratory distress.     Breath sounds: Normal breath sounds. No stridor. No wheezing, rhonchi or rales.  Chest:      Chest wall: No tenderness.  Neurological:     General: No focal deficit present.     Mental Status: He is alert and oriented to person, place, and time. Mental status is at baseline.  Psychiatric:        Mood and Affect: Mood normal.        Behavior: Behavior normal.        Thought Content: Thought content normal.        Judgment: Judgment normal.     BP 130/80   Pulse 79  Temp 97.8 F (36.6 C) (Temporal)   Resp 18   Ht 5' 11.52" (1.817 m)   Wt 276 lb 3.2 oz (125.3 kg)   SpO2 95%   BMI 37.96 kg/m  Wt Readings from Last 3 Encounters:  04/06/20 276 lb 3.2 oz (125.3 kg)  02/20/20 (!) 276 lb 9.6 oz (125.5 kg)  10/31/19 283 lb (128.4 kg)     There are no preventive care reminders to display for this patient.  There are no preventive care reminders to display for this patient.  Lab Results  Component Value Date   TSH 0.755 10/31/2019   Lab Results  Component Value Date   WBC 7.0 02/20/2020   HGB 14.6 02/20/2020   HCT 45.2 02/20/2020   MCV 87 02/20/2020   PLT 273 10/31/2019   Lab Results  Component Value Date   NA 144 10/31/2019   K 4.5 10/31/2019   CO2 23 10/31/2019   GLUCOSE 85 10/31/2019   BUN 12 10/31/2019   CREATININE 1.07 10/31/2019   BILITOT 0.3 10/31/2019   ALKPHOS 67 10/31/2019   AST 17 10/31/2019   ALT 14 10/31/2019   PROT 7.1 10/31/2019   ALBUMIN 4.3 10/31/2019   CALCIUM 9.9 10/31/2019   GFR 105.11 07/13/2018   Lab Results  Component Value Date   CHOL 203 (H) 10/31/2019   Lab Results  Component Value Date   HDL 38 (L) 10/31/2019   Lab Results  Component Value Date   LDLCALC 137 (H) 10/31/2019   Lab Results  Component Value Date   TRIG 153 (H) 10/31/2019   Lab Results  Component Value Date   CHOLHDL 5.3 (H) 10/31/2019   Lab Results  Component Value Date   HGBA1C 5.7 (H) 10/31/2019      Assessment & Plan:   Problem List Items Addressed This Visit    None    Visit Diagnoses    Gastroesophageal reflux disease, unspecified  whether esophagitis present    -  Primary   Relevant Orders   H. pylori breath test   Nasal congestion       Relevant Medications   cetirizine (ZYRTEC) 10 MG tablet   fluticasone (FLONASE) 50 MCG/ACT nasal spray   Lumbar paraspinal muscle spasm       Relevant Medications   methocarbamol (ROBAXIN) 500 MG tablet      Meds ordered this encounter  Medications  . cetirizine (ZYRTEC) 10 MG tablet    Sig: Take 1 tablet (10 mg total) by mouth daily.    Dispense:  30 tablet    Refill:  11    Order Specific Question:   Supervising Provider    Answer:   Neva Seat, JEFFREY R [[id number]]  . fluticasone (FLONASE) 50 MCG/ACT nasal spray    Sig: Place 2 sprays into both nostrils daily.    Dispense:  16 g    Refill:  6    Order Specific Question:   Supervising Provider    Answer:   Neva Seat, JEFFREY R [[id number]]  . methocarbamol (ROBAXIN) 500 MG tablet    Sig: Take 1 tablet (500 mg total) by mouth every 8 (eight) hours as needed for muscle spasms.    Dispense:  60 tablet    Refill:  0    Order Specific Question:   Supervising Provider    Answer:   Neva Seat, JEFFREY R [[id number]]    Follow-up: No follow-ups on file.   PLAN  Robaxin 500mg  PO q8h PRN for back spasms. Discussed stretching and nonpharm  with patient  Zyrtec and fluticasone for nasal congestion and ear pressure. Feel this is likely allergies given chronicity and stability, but if no improvement, will reassess.  Collected h pylori breath test for ongoing abdominal pain. If negative will consider referral to GI for further work up  Patient encouraged to call clinic with any questions, comments, or concerns.  Janeece Agee, NP

## 2020-04-09 ENCOUNTER — Other Ambulatory Visit: Payer: Self-pay | Admitting: Registered Nurse

## 2020-04-09 DIAGNOSIS — E559 Vitamin D deficiency, unspecified: Secondary | ICD-10-CM

## 2020-04-10 LAB — H. PYLORI BREATH TEST: H pylori Breath Test: NEGATIVE

## 2020-04-20 IMAGING — DX DG CHEST 2V
2 series · 2 of 2 positions shown · non-contrast
Comparison: 08/04/2016

CLINICAL DATA: Cough for 1 month

EXAM:
CHEST - 2 VIEW

[chest pa]
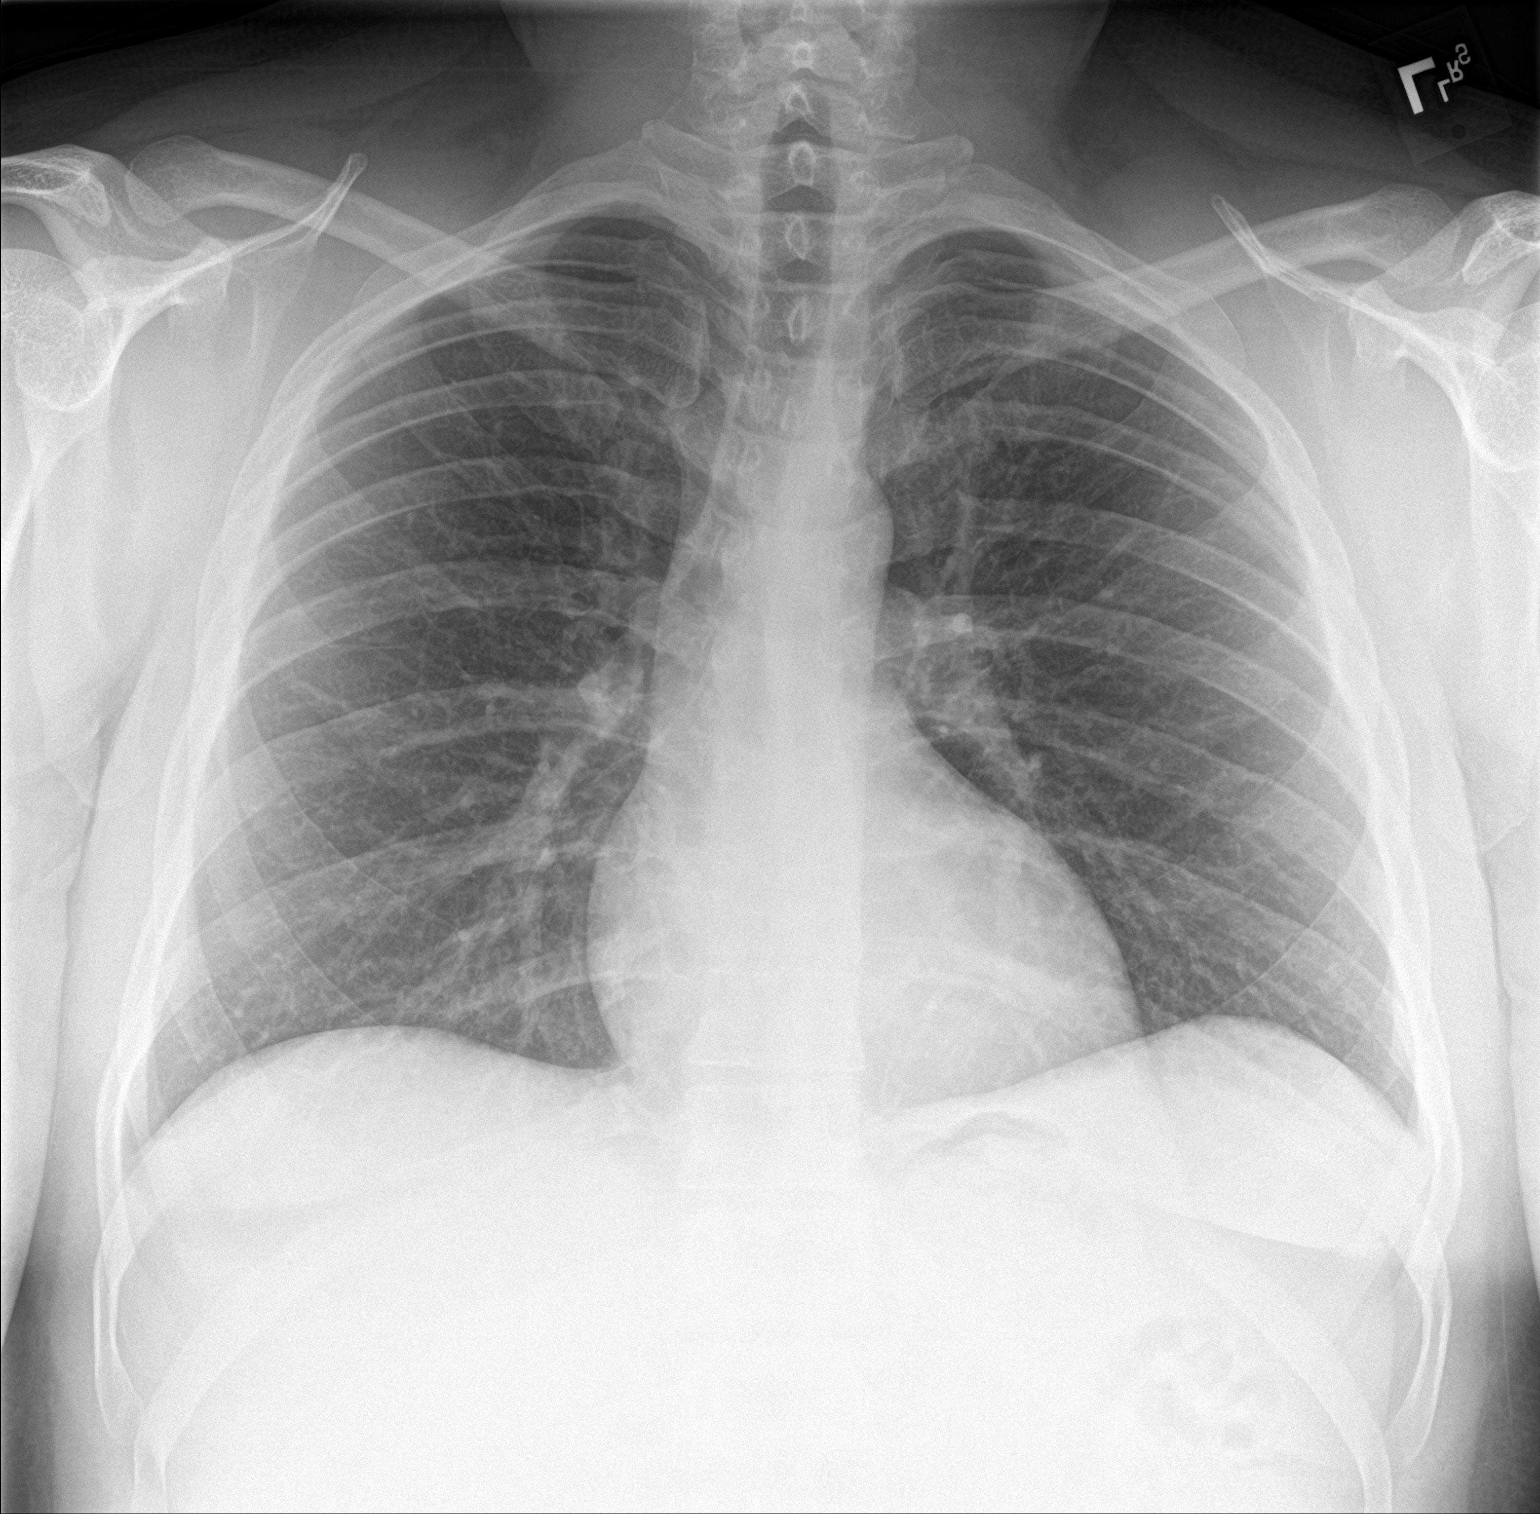

[chest lat]
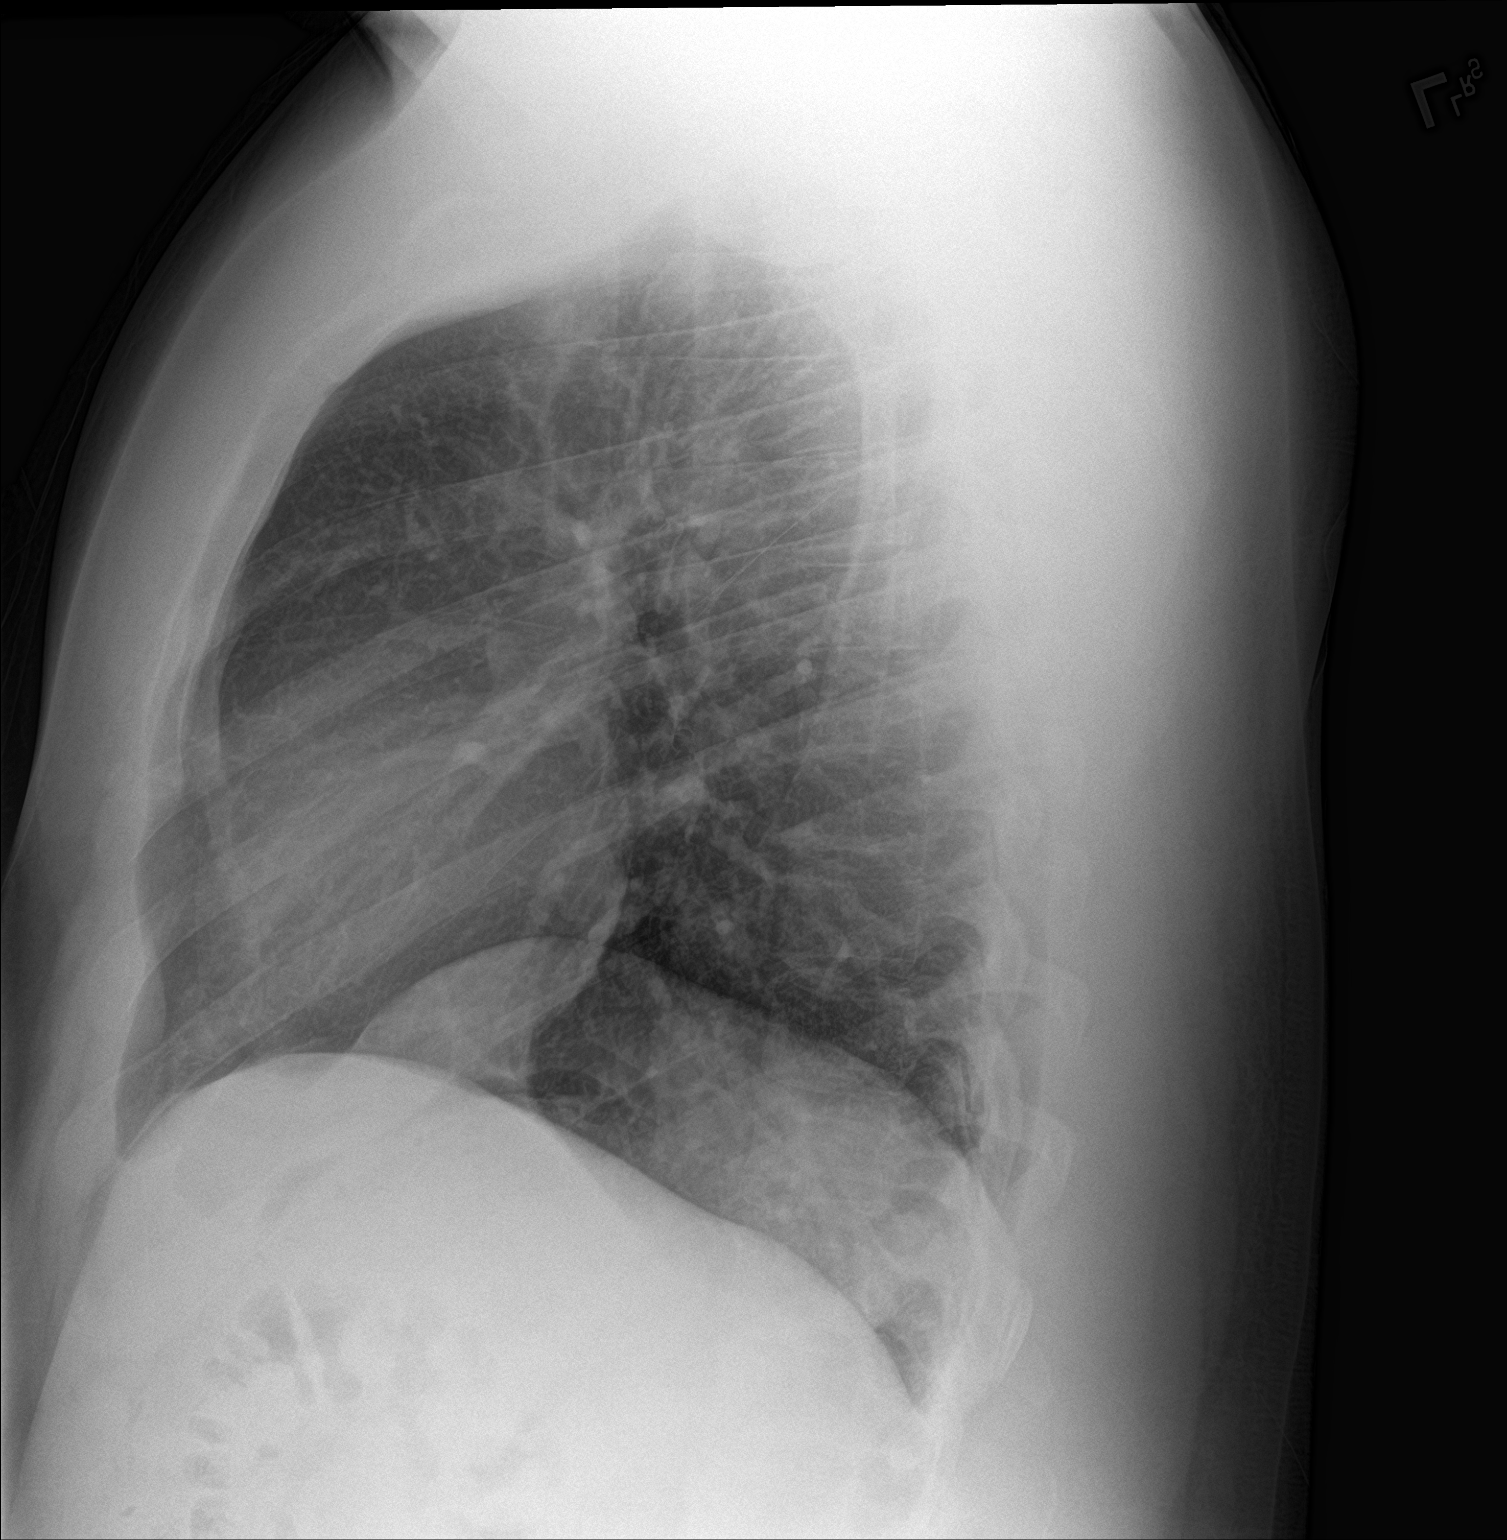

[2 of 2 positions shown; findings below may reference images not displayed]

FINDINGS: Normal heart size and mediastinal contours. No acute infiltrate or
edema. No effusion or pneumothorax. No acute osseous findings.
IMPRESSION: Negative chest.

## 2020-04-24 ENCOUNTER — Encounter: Payer: Self-pay | Admitting: Registered Nurse

## 2020-04-24 NOTE — Progress Notes (Signed)
Acute Office Visit  Subjective:    Patient ID: Micheal Gross, male    DOB: [birth year], 29 y.o.   MRN: [id number]  Chief Complaint  Patient presents with   GI Problem    patient states he is still having some stomach issues since his hospital visit in june he was prescribed omeprazole but its only working short term.    HPI Patient is in today for GI pain. Started while in hospital in June Given omeprazole Taking it when symptoms occur - relieves symptoms temporarily, but they continue to return No nvd. Denies melena, brbpr, bloating, chest pain, shob, cough, fatigue, hx of Gi bleed  No other complaints at this time  Past Medical History:  Diagnosis Date   Hyperlipidemia    Prediabetes     Past Surgical History:  Procedure Laterality Date   NO PAST SURGERIES      No family history on file.  Social History   Socioeconomic History   Marital status: Significant Other    Spouse name: Not on file   Number of children: Not on file   Years of education: Not on file   Highest education level: Not on file  Occupational History   Not on file  Tobacco Use   Smoking status: Never Smoker   Smokeless tobacco: Never Used  Vaping Use   Vaping Use: Never used  Substance and Sexual Activity   Alcohol use: Yes    Comment: occasional   Drug use: No   Sexual activity: Not on file  Other Topics Concern   Not on file  Social History Narrative   Not on file   Social Determinants of Health   Financial Resource Strain:    Difficulty of Paying Living Expenses: Not on file  Food Insecurity:    Worried About Running Out of Food in the Last Year: Not on file   Ran Out of Food in the Last Year: Not on file  Transportation Needs:    Lack of Transportation (Medical): Not on file   Lack of Transportation (Non-Medical): Not on file  Physical Activity:    Days of Exercise per Week: Not on file   Minutes of Exercise per Session: Not on file  Stress:      Feeling of Stress : Not on file  Social Connections:    Frequency of Communication with Friends and Family: Not on file   Frequency of Social Gatherings with Friends and Family: Not on file   Attends Religious Services: Not on file   Active Member of Clubs or Organizations: Not on file   Attends Banker Meetings: Not on file   Marital Status: Not on file  Intimate Partner Violence:    Fear of Current or Ex-Partner: Not on file   Emotionally Abused: Not on file   Physically Abused: Not on file   Sexually Abused: Not on file    Outpatient Medications Prior to Visit  Medication Sig Dispense Refill   cyclobenzaprine (FLEXERIL) 5 MG tablet      diclofenac (VOLTAREN) 75 MG EC tablet Take 1 tablet (75 mg total) by mouth 2 (two) times daily. 30 tablet 1   gabapentin (NEURONTIN) 100 MG capsule TAKE 1 CAPSULE BY MOUTH THREE TIMES A DAY 270 capsule 1   meloxicam (MOBIC) 7.5 MG tablet TAKE 1 TABLET BY MOUTH EVERY DAY 30 tablet 0   naproxen (NAPROSYN) 500 MG tablet Take 1 tablet (500 mg total) by mouth 2 (two) times daily. 30 tablet 0  Vitamin D, Ergocalciferol, (DRISDOL) 1.25 MG (50000 UNIT) CAPS capsule TAKE 1 CAPSULE (50,000 UNITS TOTAL) BY MOUTH EVERY 7 (SEVEN) DAYS. 8 capsule 0   indomethacin (INDOCIN) 50 MG capsule Take 1 capsule (50 mg total) by mouth 3 (three) times daily as needed for mild pain or moderate pain. (Patient not taking: Reported on 09/29/2019) 90 capsule 0   omeprazole (PRILOSEC) 20 MG capsule Take 1 capsule (20 mg total) by mouth 2 (two) times daily before a meal for 14 days. 28 capsule 0   predniSONE (DELTASONE) 20 MG tablet Take 3 pills each morning for 2 days, then 2 daily for 2 days, then 1 daily for 2 days then one half daily for 4 days (Patient not taking: Reported on 10/31/2019) 14 tablet 0   No facility-administered medications prior to visit.    No Known Allergies  Review of Systems  Constitutional: Negative.   HENT: Negative.    Eyes: Negative.   Respiratory: Negative.   Cardiovascular: Negative.   Gastrointestinal: Negative.   Genitourinary: Negative.   Musculoskeletal: Negative.   Skin: Negative.   Neurological: Negative.   Psychiatric/Behavioral: Negative.        Objective:    Physical Exam Vitals and nursing note reviewed.  Constitutional:      General: He is not in acute distress.    Appearance: Normal appearance. He is not ill-appearing, toxic-appearing or diaphoretic.  Cardiovascular:     Rate and Rhythm: Normal rate and regular rhythm.  Pulmonary:     Effort: Pulmonary effort is normal. No respiratory distress.  Abdominal:     General: Abdomen is flat. Bowel sounds are normal. There is no distension.     Palpations: There is no mass.     Tenderness: There is abdominal tenderness (mild, epigastric). There is no right CVA tenderness, left CVA tenderness, guarding or rebound.     Hernia: No hernia is present.  Neurological:     General: No focal deficit present.     Mental Status: He is alert and oriented to person, place, and time. Mental status is at baseline.  Psychiatric:        Mood and Affect: Mood normal.        Behavior: Behavior normal.        Thought Content: Thought content normal.        Judgment: Judgment normal.     BP 121/80    Pulse 72    Temp 98.3 F (36.8 C) (Temporal)    Resp 18    Ht 5' 11.5" (1.816 m)    Wt (!) 276 lb 9.6 oz (125.5 kg)    SpO2 94%    BMI 38.04 kg/m  Wt Readings from Last 3 Encounters:  04/06/20 276 lb 3.2 oz (125.3 kg)  02/20/20 (!) 276 lb 9.6 oz (125.5 kg)  10/31/19 283 lb (128.4 kg)    Health Maintenance Due  Topic Date Due   COVID-19 Vaccine (1) Never done    There are no preventive care reminders to display for this patient.   Lab Results  Component Value Date   TSH 0.755 10/31/2019   Lab Results  Component Value Date   WBC 7.0 02/20/2020   HGB 14.6 02/20/2020   HCT 45.2 02/20/2020   MCV 87 02/20/2020   PLT 273 10/31/2019    Lab Results  Component Value Date   NA 144 10/31/2019   K 4.5 10/31/2019   CO2 23 10/31/2019   GLUCOSE 85 10/31/2019   BUN 12 10/31/2019  CREATININE 1.07 10/31/2019   BILITOT 0.3 10/31/2019   ALKPHOS 67 10/31/2019   AST 17 10/31/2019   ALT 14 10/31/2019   PROT 7.1 10/31/2019   ALBUMIN 4.3 10/31/2019   CALCIUM 9.9 10/31/2019   GFR 105.11 07/13/2018   Lab Results  Component Value Date   CHOL 203 (H) 10/31/2019   Lab Results  Component Value Date   HDL 38 (L) 10/31/2019   Lab Results  Component Value Date   LDLCALC 137 (H) 10/31/2019   Lab Results  Component Value Date   TRIG 153 (H) 10/31/2019   Lab Results  Component Value Date   CHOLHDL 5.3 (H) 10/31/2019   Lab Results  Component Value Date   HGBA1C 5.7 (H) 10/31/2019       Assessment & Plan:   Problem List Items Addressed This Visit    None    Visit Diagnoses    Dark stools    -  Primary   Relevant Orders   CBC With Differential (Completed)   Vitamin D deficiency       Relevant Orders   VITAMIN D 25 Hydroxy (Vit-D Deficiency, Fractures) (Completed)   Gastroesophageal reflux disease, unspecified whether esophagitis present       Relevant Medications   pantoprazole (PROTONIX) 40 MG tablet   sucralfate (CARAFATE) 1 g tablet       Meds ordered this encounter  Medications   pantoprazole (PROTONIX) 40 MG tablet    Sig: Take 1 tablet (40 mg total) by mouth daily.    Dispense:  30 tablet    Refill:  3    Order Specific Question:   Supervising Provider    Answer:   Neva Seat, JEFFREY R [[id number]]   sucralfate (CARAFATE) 1 g tablet    Sig: Take 1 tablet (1 g total) by mouth 4 (four) times daily -  with meals and at bedtime.    Dispense:  40 tablet    Refill:  0    Order Specific Question:   Supervising Provider    Answer:   Neva Seat, JEFFREY R [[id number]]   PLAN  Will switch to pantoprazole 40mg  PO qd. Take daily for at least 2 weeks at a time  Sucralfate 1g tablet taken daily before meals and before  bed  Return if symptoms worsen or fail to improve  Labs drawn to follow up on hx of vt d deficiency and rule out serious GI bleed  Patient encouraged to call clinic with any questions, comments, or concerns.   , NP

## 2020-04-26 ENCOUNTER — Encounter: Payer: Self-pay | Admitting: Registered Nurse

## 2020-04-26 DIAGNOSIS — J811 Chronic pulmonary edema: Secondary | ICD-10-CM

## 2020-04-26 DIAGNOSIS — R0981 Nasal congestion: Secondary | ICD-10-CM

## 2020-04-26 NOTE — Telephone Encounter (Signed)
If we could place a referral to ENT that would be awesome I'll cosign  Thank you  Jari Sportsman, NP

## 2020-04-26 NOTE — Telephone Encounter (Signed)
Pt has concerns following appt from 2 weeks ago notes issue with breathing fully is present and struggling to notice a difference with the cetrizine

## 2020-05-04 NOTE — Telephone Encounter (Signed)
Pt spoke with ENT and cannot get appt until 26th of October, requesting Zpak as that's what helped in the past please advise

## 2020-05-06 ENCOUNTER — Other Ambulatory Visit: Payer: Self-pay | Admitting: Registered Nurse

## 2020-05-06 DIAGNOSIS — J329 Chronic sinusitis, unspecified: Secondary | ICD-10-CM

## 2020-05-06 MED ORDER — AZITHROMYCIN 250 MG PO TABS: ORAL_TABLET | ORAL | 0 refills | Status: DC

## 2020-05-10 ENCOUNTER — Other Ambulatory Visit: Payer: Self-pay

## 2020-05-10 ENCOUNTER — Ambulatory Visit (INDEPENDENT_AMBULATORY_CARE_PROVIDER_SITE_OTHER): Payer: BC Managed Care – PPO | Admitting: Otolaryngology

## 2020-05-10 ENCOUNTER — Encounter (INDEPENDENT_AMBULATORY_CARE_PROVIDER_SITE_OTHER): Payer: Self-pay | Admitting: Otolaryngology

## 2020-05-10 VITALS — Temp 96.8°F

## 2020-05-10 DIAGNOSIS — J3489 Other specified disorders of nose and nasal sinuses: Secondary | ICD-10-CM | POA: Diagnosis not present

## 2020-05-10 DIAGNOSIS — J339 Nasal polyp, unspecified: Secondary | ICD-10-CM

## 2020-05-10 DIAGNOSIS — H9041 Sensorineural hearing loss, unilateral, right ear, with unrestricted hearing on the contralateral side: Secondary | ICD-10-CM | POA: Diagnosis not present

## 2020-05-10 NOTE — Progress Notes (Signed)
HPI: Micheal Gross is a 29 y.o. male who presents is referred by Janeece Agee, NP for evaluation of chronic nasal congestion.  He also complains of decreased hearing in the right ear that started back in July as was the nasal congestion.  Sometimes it makes him gag to talk.  He tried Flonase for several weeks but this did not seem to help.  He also complains of blockage of the right ear..  Past Medical History:  Diagnosis Date  . Hyperlipidemia   . Prediabetes    Past Surgical History:  Procedure Laterality Date  . NO PAST SURGERIES     Social History   Socioeconomic History  . Marital status: Significant Other    Spouse name: Not on file  . Number of children: Not on file  . Years of education: Not on file  . Highest education level: Not on file  Occupational History  . Not on file  Tobacco Use  . Smoking status: Never Smoker  . Smokeless tobacco: Never Used  Vaping Use  . Vaping Use: Never used  Substance and Sexual Activity  . Alcohol use: Yes    Comment: occasional  . Drug use: No  . Sexual activity: Not on file  Other Topics Concern  . Not on file  Social History Narrative  . Not on file   Social Determinants of Health   Financial Resource Strain:   . Difficulty of Paying Living Expenses: Not on file  Food Insecurity:   . Worried About Programme researcher, broadcasting/film/video in the Last Year: Not on file  . Ran Out of Food in the Last Year: Not on file  Transportation Needs:   . Lack of Transportation (Medical): Not on file  . Lack of Transportation (Non-Medical): Not on file  Physical Activity:   . Days of Exercise per Week: Not on file  . Minutes of Exercise per Session: Not on file  Stress:   . Feeling of Stress : Not on file  Social Connections:   . Frequency of Communication with Friends and Family: Not on file  . Frequency of Social Gatherings with Friends and Family: Not on file  . Attends Religious Services: Not on file  . Active Member of Clubs or  Organizations: Not on file  . Attends Banker Meetings: Not on file  . Marital Status: Not on file   No family history on file. No Known Allergies Prior to Admission medications   Medication Sig Start Date End Date Taking? Authorizing Provider  azithromycin (ZITHROMAX) 250 MG tablet Take 2 tabs on first day, then take 1 tab daily. Finish entire supply. Patient not taking: Reported on 05/10/2020 05/06/20   Janeece Agee, NP  cetirizine (ZYRTEC) 10 MG tablet Take 1 tablet (10 mg total) by mouth daily. 04/06/20   Janeece Agee, NP  cyclobenzaprine (FLEXERIL) 5 MG tablet  11/01/18   [provider]  diclofenac (VOLTAREN) 75 MG EC tablet Take 1 tablet (75 mg total) by mouth 2 (two) times daily. 09/29/19   Peyton Najjar, MD  fluticasone (FLONASE) 50 MCG/ACT nasal spray Place 2 sprays into both nostrils daily. 04/06/20   Janeece Agee, NP  gabapentin (NEURONTIN) 100 MG capsule TAKE 1 CAPSULE BY MOUTH THREE TIMES A DAY 12/13/18   Myra Rude, MD  indomethacin (INDOCIN) 50 MG capsule Take 1 capsule (50 mg total) by mouth 3 (three) times daily as needed for mild pain or moderate pain. Patient not taking: Reported on 09/29/2019 08/11/18  Everrett Coombe, DO  meloxicam (MOBIC) 7.5 MG tablet TAKE 1 TABLET BY MOUTH EVERY DAY 12/05/19   Janeece Agee, NP  methocarbamol (ROBAXIN) 500 MG tablet Take 1 tablet (500 mg total) by mouth every 8 (eight) hours as needed for muscle spasms. 04/06/20   Janeece Agee, NP  naproxen (NAPROSYN) 500 MG tablet Take 1 tablet (500 mg total) by mouth 2 (two) times daily. 01/03/20   Wieters, Hallie C, PA-C  omeprazole (PRILOSEC) 20 MG capsule Take 1 capsule (20 mg total) by mouth 2 (two) times daily before a meal for 14 days. 01/03/20 01/17/20  Wieters, Hallie C, PA-C  pantoprazole (PROTONIX) 40 MG tablet Take 1 tablet (40 mg total) by mouth daily. 02/20/20   Janeece Agee, NP  predniSONE (DELTASONE) 20 MG tablet Take 3 pills each morning for 2 days, then 2  daily for 2 days, then 1 daily for 2 days then one half daily for 4 days Patient not taking: Reported on 10/31/2019 09/29/19   Peyton Najjar, MD  sucralfate (CARAFATE) 1 g tablet Take 1 tablet (1 g total) by mouth 4 (four) times daily -  with meals and at bedtime. 02/20/20   Janeece Agee, NP  Vitamin D, Ergocalciferol, (DRISDOL) 1.25 MG (50000 UNIT) CAPS capsule TAKE 1 CAPSULE (50,000 UNITS TOTAL) BY MOUTH EVERY 7 (SEVEN) DAYS. 04/09/20   Janeece Agee, NP     Positive ROS: Otherwise negative  All other systems have been reviewed and were otherwise negative with the exception of those mentioned in the HPI and as above.  Physical Exam: Constitutional: Alert, well-appearing, no acute distress Ears: External ears without lesions or tenderness.  He had minimal wax buildup in both ear canals that was cleaned with curette.  The TMs were clear bilaterally with good mobility on pneumatic otoscopy.  On hearing screening with the 512 1024 tuning fork he heard a little bit better in the left ear compared to the right but minimal difference and Weber was midline.  AC > BC bilaterally. Nasal: External nose without lesions. Septum slightly deviated to the left.  After decongesting the nose nasal endoscopy was performed in the office today.  On nasal endoscopy patient has a large polyp extruding from the right middle meatus that extends all the way back to the nasopharynx.  He has mild edema on the left side but did not appreciate any obvious large polyps although he does have septal deviation to the left posteriorly. Oral: Lips and gums without lesions. Tongue and palate mucosa without lesions. Posterior oropharynx clear. Neck: No palpable adenopathy or masses Respiratory: Breathing comfortably  Skin: No facial/neck lesions or rash noted.  Nasal/sinus endoscopy  Date/Time: 05/10/2020 4:47 PM Performed by: Drema Halon, MD Authorized by: Drema Halon, MD   Consent:    Consent obtained:   Verbal   Consent given by:  Patient Procedure details:    Indications: sino-nasal symptoms     Medication:  Afrin   Instrument: flexible fiberoptic nasal endoscope     Scope location: bilateral nare   Septum:    normal     Deviation: deviated to the left     Severity of deviation: intermediate   Sinus:    Right middle meatus: normal     polyps     Left middle meatus: normal   Comments:     On nasal endoscopy patient has large polyp in the right nostril that extends back to the nasopharynx.  He has septal deviation to the left.  Assessment: Right ear hearing loss. Nasal obstruction secondary to large right intranasal polyp and septal deviation to the left.  Plan: Placed him on steroids prednisone Dosepak for 7 days starting with 60 mg x 2 days. Also prescribed Nasacort 2 sprays each nostril at night or use of his Flonase which he already has 2 sprays each nostril at night. We will plan on scheduling a CT scan of his sinuses to further delineate the extent of the nasal polyps. He will follow-up in 3 to 4 weeks for recheck and we will schedule audiogram on follow-up visit to evaluate right ear hearing loss.   Narda Bonds, MD   CC:

## 2020-05-11 NOTE — Addendum Note (Signed)
Addended by: Ludwig Clarks on: 05/11/2020 08:42 AM   Modules accepted: Orders

## 2020-05-13 ENCOUNTER — Other Ambulatory Visit: Payer: Self-pay | Admitting: Registered Nurse

## 2020-05-13 DIAGNOSIS — K219 Gastro-esophageal reflux disease without esophagitis: Secondary | ICD-10-CM

## 2020-05-13 NOTE — Telephone Encounter (Signed)
Requested medication (s) are due for refill today: yes  Requested medication (s) are on the active medication list: yes  Last refill:  02/20/20  Future visit scheduled: no  Notes to clinic:  per last OV- was going to to take med for 2 weeks and see how it does- please review if RF appropriate   Requested Prescriptions  Pending Prescriptions Disp Refills   pantoprazole (PROTONIX) 40 MG tablet [Pharmacy Med Name: PANTOPRAZOLE SOD DR 40 MG TAB] 90 tablet 1    Sig: TAKE 1 TABLET BY MOUTH EVERY DAY      Gastroenterology: Proton Pump Inhibitors Passed - 05/13/2020 12:06 AM      Passed - Valid encounter within last 12 months    Recent Outpatient Visits           1 month ago Gastroesophageal reflux disease, unspecified whether esophagitis present   [facility], Gerlene Burdock, NP   2 months ago Dark stools   [facility], Gerlene Burdock, NP   6 months ago Encounter to establish care   [facility], Gerlene Burdock, NP   7 months ago Right cervical radiculopathy   [facility], Sandria Bales, MD   2 years ago Post-viral cough syndrome   [facility] at [facility], Sandria Bales, MD

## 2020-05-15 ENCOUNTER — Other Ambulatory Visit: Payer: Self-pay | Admitting: Emergency Medicine

## 2020-05-15 DIAGNOSIS — K219 Gastro-esophageal reflux disease without esophagitis: Secondary | ICD-10-CM

## 2020-05-15 MED ORDER — PANTOPRAZOLE SODIUM 40 MG PO TBEC: 40.0000 mg | DELAYED_RELEASE_TABLET | Freq: Every day | ORAL | 1 refills | Status: DC

## 2020-05-24 ENCOUNTER — Other Ambulatory Visit (INDEPENDENT_AMBULATORY_CARE_PROVIDER_SITE_OTHER): Payer: Self-pay

## 2020-05-24 DIAGNOSIS — J3489 Other specified disorders of nose and nasal sinuses: Secondary | ICD-10-CM

## 2020-06-12 ENCOUNTER — Other Ambulatory Visit (INDEPENDENT_AMBULATORY_CARE_PROVIDER_SITE_OTHER): Payer: Self-pay

## 2020-06-12 MED ORDER — PREDNISONE 10 MG PO TABS
10.0000 mg | ORAL_TABLET | Freq: Every day | ORAL | 0 refills | Status: AC
Start: 2020-06-12 — End: 2020-06-18

## 2020-06-13 ENCOUNTER — Telehealth (INDEPENDENT_AMBULATORY_CARE_PROVIDER_SITE_OTHER): Payer: Self-pay

## 2020-06-14 ENCOUNTER — Ambulatory Visit (INDEPENDENT_AMBULATORY_CARE_PROVIDER_SITE_OTHER): Payer: BC Managed Care – PPO | Admitting: Otolaryngology

## 2020-11-28 ENCOUNTER — Encounter: Payer: Self-pay | Admitting: Registered Nurse

## 2020-12-06 NOTE — Telephone Encounter (Signed)
Please advise message below  °

## 2021-01-02 ENCOUNTER — Ambulatory Visit (HOSPITAL_COMMUNITY)
Admission: EM | Admit: 2021-01-02 | Discharge: 2021-01-03 | Disposition: A | Discharge status: Home / Self Care | Payer: BC Managed Care – PPO | Attending: Emergency Medicine | Admitting: Emergency Medicine

## 2021-01-02 ENCOUNTER — Emergency Department (HOSPITAL_COMMUNITY): Payer: BC Managed Care – PPO

## 2021-01-02 ENCOUNTER — Encounter (HOSPITAL_COMMUNITY): Payer: Self-pay

## 2021-01-02 DIAGNOSIS — N50812 Left testicular pain: Secondary | ICD-10-CM | POA: Diagnosis present

## 2021-01-02 DIAGNOSIS — Z20822 Contact with and (suspected) exposure to covid-19: Secondary | ICD-10-CM | POA: Diagnosis not present

## 2021-01-02 DIAGNOSIS — R609 Edema, unspecified: Secondary | ICD-10-CM

## 2021-01-02 DIAGNOSIS — N4400 Torsion of testis, unspecified: Secondary | ICD-10-CM

## 2021-01-02 NOTE — ED Notes (Signed)
Patient transported to Ultrasound per wheelchair. Provider aware.

## 2021-01-02 NOTE — ED Triage Notes (Signed)
Pt reports that he has had a swollen L testicle since Monday that has been getting worse gradually over time, denies dysuria or discharge.

## 2021-01-02 NOTE — ED Notes (Signed)
Pt received from lobby, ambulatory with steady gait.

## 2021-01-03 ENCOUNTER — Other Ambulatory Visit: Payer: Self-pay

## 2021-01-03 ENCOUNTER — Emergency Department (HOSPITAL_COMMUNITY): Payer: BC Managed Care – PPO | Admitting: Certified Registered Nurse Anesthetist

## 2021-01-03 ENCOUNTER — Encounter (HOSPITAL_COMMUNITY)
Admission: EM | Disposition: A | Discharge status: Home / Self Care | Payer: Self-pay | Source: Home / Self Care | Attending: Emergency Medicine

## 2021-01-03 ENCOUNTER — Encounter (HOSPITAL_COMMUNITY): Payer: Self-pay | Admitting: Certified Registered Nurse Anesthetist

## 2021-01-03 HISTORY — PX: ORCHIOPEXY: SHX479

## 2021-01-03 LAB — CBC WITH DIFFERENTIAL/PLATELET
Abs Immature Granulocytes: 0.05 10*3/uL (ref 0.00–0.07)
Basophils Absolute: 0.1 10*3/uL (ref 0.0–0.1)
Basophils Relative: 1 %
Eosinophils Absolute: 0.1 10*3/uL (ref 0.0–0.5)
Eosinophils Relative: 1 %
HCT: 41.5 % (ref 39.0–52.0)
Hemoglobin: 13.3 g/dL (ref 13.0–17.0)
Immature Granulocytes: 0 %
Lymphocytes Relative: 23 %
Lymphs Abs: 3.6 10*3/uL (ref 0.7–4.0)
MCH: 28.4 pg (ref 26.0–34.0)
MCHC: 32 g/dL (ref 30.0–36.0)
MCV: 88.5 fL (ref 80.0–100.0)
Monocytes Absolute: 1 10*3/uL (ref 0.1–1.0)
Monocytes Relative: 7 %
Neutro Abs: 10.5 10*3/uL — ABNORMAL HIGH (ref 1.7–7.7)
Neutrophils Relative %: 68 %
Platelets: 310 10*3/uL (ref 150–400)
RBC: 4.69 MIL/uL (ref 4.22–5.81)
RDW: 14.1 % (ref 11.5–15.5)
WBC: 15.3 10*3/uL — ABNORMAL HIGH (ref 4.0–10.5)
nRBC: 0 % (ref 0.0–0.2)

## 2021-01-03 LAB — RESP PANEL BY RT-PCR (FLU A&B, COVID) ARPGX2
Influenza A by PCR: NEGATIVE
Influenza B by PCR: NEGATIVE
SARS Coronavirus 2 by RT PCR: NEGATIVE

## 2021-01-03 LAB — COMPREHENSIVE METABOLIC PANEL
ALT: 19 U/L (ref 0–44)
AST: 25 U/L (ref 15–41)
Albumin: 3.7 g/dL (ref 3.5–5.0)
Alkaline Phosphatase: 51 U/L (ref 38–126)
Anion gap: 10 (ref 5–15)
BUN: 10 mg/dL (ref 6–20)
CO2: 26 mmol/L (ref 22–32)
Calcium: 9.5 mg/dL (ref 8.9–10.3)
Chloride: 101 mmol/L (ref 98–111)
Creatinine, Ser: 1.21 mg/dL (ref 0.61–1.24)
GFR, Estimated: >60 mL/min (ref >60)
Glucose, Bld: 101 mg/dL — ABNORMAL HIGH (ref 70–99)
Potassium: 4 mmol/L (ref 3.5–5.1)
Sodium: 137 mmol/L (ref 135–145)
Total Bilirubin: 0.9 mg/dL (ref 0.3–1.2)
Total Protein: 6.3 g/dL — ABNORMAL LOW (ref 6.5–8.1)

## 2021-01-03 LAB — PROTIME-INR
INR: 1 (ref 0.8–1.2)
Prothrombin Time: 13.4 seconds (ref 11.4–15.2)

## 2021-01-03 SURGERY — ORCHIOPEXY ADULT
Anesthesia: General | Laterality: Bilateral

## 2021-01-03 MED ORDER — MIDAZOLAM HCL 5 MG/5ML IJ SOLN
INTRAMUSCULAR | Status: DC | PRN
  Administered 2021-01-03: 2 mg via INTRAVENOUS

## 2021-01-03 MED ORDER — PHENYLEPHRINE HCL (PRESSORS) 10 MG/ML IV SOLN
INTRAVENOUS | Status: DC | PRN
  Administered 2021-01-03 (×4): 80 ug via INTRAVENOUS

## 2021-01-03 MED ORDER — HYDROCODONE-ACETAMINOPHEN 5-325 MG PO TABS: 1.0000 | ORAL_TABLET | ORAL | 0 refills | Status: DC | PRN

## 2021-01-03 MED ORDER — PROPOFOL 10 MG/ML IV BOLUS
INTRAVENOUS | Status: AC
  Filled 2021-01-03: qty 20

## 2021-01-03 MED ORDER — MIDAZOLAM HCL 2 MG/2ML IJ SOLN
INTRAMUSCULAR | Status: AC
  Filled 2021-01-03: qty 2

## 2021-01-03 MED ORDER — OXYCODONE HCL 5 MG PO TABS
ORAL_TABLET | ORAL | Status: AC
  Filled 2021-01-03: qty 1

## 2021-01-03 MED ORDER — ROCURONIUM BROMIDE 100 MG/10ML IV SOLN
INTRAVENOUS | Status: DC | PRN
  Administered 2021-01-03: 40 mg via INTRAVENOUS

## 2021-01-03 MED ORDER — HYDROMORPHONE HCL 1 MG/ML IJ SOLN: 0.2500 mg | INTRAMUSCULAR | Status: DC | PRN

## 2021-01-03 MED ORDER — PROPOFOL 10 MG/ML IV BOLUS
INTRAVENOUS | Status: DC | PRN
  Administered 2021-01-03: 200 mg via INTRAVENOUS

## 2021-01-03 MED ORDER — AMISULPRIDE (ANTIEMETIC) 5 MG/2ML IV SOLN
10.0000 mg | Freq: Once | INTRAVENOUS | Status: AC
  Administered 2021-01-03: 10 mg via INTRAVENOUS

## 2021-01-03 MED ORDER — DEXMEDETOMIDINE (PRECEDEX) IN NS 20 MCG/5ML (4 MCG/ML) IV SYRINGE
PREFILLED_SYRINGE | INTRAVENOUS | Status: DC | PRN
  Administered 2021-01-03: 12 ug via INTRAVENOUS

## 2021-01-03 MED ORDER — FENTANYL CITRATE (PF) 250 MCG/5ML IJ SOLN
INTRAMUSCULAR | Status: AC
  Filled 2021-01-03: qty 5

## 2021-01-03 MED ORDER — AMISULPRIDE (ANTIEMETIC) 5 MG/2ML IV SOLN
INTRAVENOUS | Status: AC
  Filled 2021-01-03: qty 4

## 2021-01-03 MED ORDER — SUCCINYLCHOLINE CHLORIDE 20 MG/ML IJ SOLN
INTRAMUSCULAR | Status: DC | PRN
  Administered 2021-01-03: 140 mg via INTRAVENOUS

## 2021-01-03 MED ORDER — FENTANYL CITRATE (PF) 100 MCG/2ML IJ SOLN
INTRAMUSCULAR | Status: DC | PRN
  Administered 2021-01-03: 50 ug via INTRAVENOUS
  Administered 2021-01-03: 100 ug via INTRAVENOUS

## 2021-01-03 MED ORDER — BUPIVACAINE HCL (PF) 0.25 % IJ SOLN
INTRAMUSCULAR | Status: AC
  Filled 2021-01-03: qty 30

## 2021-01-03 MED ORDER — ONDANSETRON HCL 4 MG/2ML IJ SOLN
INTRAMUSCULAR | Status: DC | PRN
  Administered 2021-01-03: 4 mg via INTRAVENOUS

## 2021-01-03 MED ORDER — BUPIVACAINE HCL (PF) 0.25 % IJ SOLN
INTRAMUSCULAR | Status: DC | PRN
  Administered 2021-01-03: 10 mL

## 2021-01-03 MED ORDER — ONDANSETRON HCL 4 MG/2ML IJ SOLN
INTRAMUSCULAR | Status: AC
  Filled 2021-01-03: qty 2

## 2021-01-03 MED ORDER — ACETAMINOPHEN 10 MG/ML IV SOLN
INTRAVENOUS | Status: AC
  Filled 2021-01-03: qty 100

## 2021-01-03 MED ORDER — OXYCODONE HCL 5 MG PO TABS
5.0000 mg | ORAL_TABLET | Freq: Once | ORAL | Status: AC
  Administered 2021-01-03: 5 mg via ORAL

## 2021-01-03 MED ORDER — ACETAMINOPHEN 10 MG/ML IV SOLN
1000.0000 mg | Freq: Once | INTRAVENOUS | Status: AC
  Administered 2021-01-03: 1000 mg via INTRAVENOUS

## 2021-01-03 MED ORDER — LACTATED RINGERS IV SOLN: INTRAVENOUS | Status: DC | PRN

## 2021-01-03 MED ORDER — DEXTROSE 5 % IV SOLN
INTRAVENOUS | Status: DC | PRN
  Administered 2021-01-03: 3 g via INTRAVENOUS

## 2021-01-03 MED ORDER — CEFAZOLIN SODIUM 1 G IJ SOLR
INTRAMUSCULAR | Status: AC
  Filled 2021-01-03: qty 50

## 2021-01-03 MED ORDER — LIDOCAINE HCL (CARDIAC) PF 100 MG/5ML IV SOSY
PREFILLED_SYRINGE | INTRAVENOUS | Status: DC | PRN
  Administered 2021-01-03: 80 mg via INTRAVENOUS

## 2021-01-03 MED ORDER — SUGAMMADEX SODIUM 200 MG/2ML IV SOLN
INTRAVENOUS | Status: DC | PRN
  Administered 2021-01-03: 400 mg via INTRAVENOUS

## 2021-01-03 SURGICAL SUPPLY — 39 items
BAG DECANTER FOR FLEXI CONT (MISCELLANEOUS) ×2 IMPLANT
BLADE SURG 15 STRL LF DISP TIS (BLADE) ×1 IMPLANT
BLADE SURG 15 STRL SS (BLADE) ×1
CATH ROBINSON RED A/P 14FR (CATHETERS) IMPLANT
CATH ROBINSON RED A/P 16FR (CATHETERS) IMPLANT
COVER SURGICAL LIGHT HANDLE (MISCELLANEOUS) ×4 IMPLANT
COVER WAND RF STERILE (DRAPES) ×2 IMPLANT
DRAPE HALF SHEET 40X57 (DRAPES) IMPLANT
DRAPE LAPAROTOMY T 102X78X121 (DRAPES) IMPLANT
ELECT REM PT RETURN 9FT ADLT (ELECTROSURGICAL) ×2
ELECTRODE REM PT RTRN 9FT ADLT (ELECTROSURGICAL) ×1 IMPLANT
GAUZE 4X4 16PLY RFD (DISPOSABLE) ×2 IMPLANT
GLOVE BIOGEL M STRL SZ7.5 (GLOVE) ×2 IMPLANT
GOWN STRL REUS W/ TWL LRG LVL3 (GOWN DISPOSABLE) ×2 IMPLANT
GOWN STRL REUS W/TWL LRG LVL3 (GOWN DISPOSABLE) ×2
HANDPIECE INTERPULSE COAX TIP (DISPOSABLE)
KIT BASIN OR (CUSTOM PROCEDURE TRAY) ×2 IMPLANT
KIT TURNOVER KIT B (KITS) ×2 IMPLANT
LEGGING LITHOTOMY PAIR STRL (DRAPES) IMPLANT
NDL HYPO 25X1 1.5 SAFETY (NEEDLE) IMPLANT
NEEDLE HYPO 25X1 1.5 SAFETY (NEEDLE) IMPLANT
NS IRRIG 1000ML POUR BTL (IV SOLUTION) ×2 IMPLANT
PACK SURGICAL SETUP 50X90 (CUSTOM PROCEDURE TRAY) ×2 IMPLANT
PAD ARMBOARD 7.5X6 YLW CONV (MISCELLANEOUS) ×4 IMPLANT
PENCIL BUTTON HOLSTER BLD 10FT (ELECTRODE) ×2 IMPLANT
SET HNDPC FAN SPRY TIP SCT (DISPOSABLE) IMPLANT
SPONGE LAP 4X18 RFD (DISPOSABLE) IMPLANT
SUT CHROMIC 3 0 PS 2 (SUTURE) IMPLANT
SUT CHROMIC 3 0 SH 27 (SUTURE) ×1 IMPLANT
SUT CHROMIC 4 0 RB 1X27 (SUTURE) IMPLANT
SUT PROLENE 4 0 RB 1 (SUTURE) ×3
SUT PROLENE 4-0 RB1 .5 CRCL 36 (SUTURE) IMPLANT
SUT PROLENE 4-0 RB1 18X2 ARM (SUTURE) ×1 IMPLANT
SUT SILK 2 0 SH (SUTURE) ×1 IMPLANT
SYR CONTROL 10ML LL (SYRINGE) ×2 IMPLANT
TOWEL GREEN STERILE FF (TOWEL DISPOSABLE) ×2 IMPLANT
TUBE CONNECTING 12X1/4 (SUCTIONS) ×2 IMPLANT
WATER STERILE IRR 1000ML POUR (IV SOLUTION) ×2 IMPLANT
YANKAUER SUCT BULB TIP NO VENT (SUCTIONS) ×2 IMPLANT

## 2021-01-03 NOTE — ED Notes (Signed)
Pt states he is unable to provided urine sample at this time. Pt changed into hospital gown.

## 2021-01-03 NOTE — Transfer of Care (Signed)
Immediate Anesthesia Transfer of Care Note  Patient: Micheal Gross  Procedure(s) Performed: SCROTAL EXPLORATION, RIGHT ORCHIOPEXY , LEFT ORCHIECTOMY (Bilateral)  Patient Location: PACU  Anesthesia Type:General  Level of Consciousness: drowsy  Airway & Oxygen Therapy: Patient Spontanous Breathing and Patient connected to nasal cannula oxygen  Post-op Assessment: Report given to RN, Post -op Vital signs reviewed and stable and Patient moving all extremities  Post vital signs: Reviewed and stable  Last Vitals:  Vitals Value Taken Time  BP 128/91 01/03/21 0438  Temp 36.2 C 01/03/21 0438  Pulse 100 01/03/21 0443  Resp 14 01/03/21 0443  SpO2 97 % 01/03/21 0443  Vitals shown include unvalidated device data.  Last Pain:  Vitals:   01/03/21 0249  TempSrc: Oral  PainSc: 3          Complications: No notable events documented.

## 2021-01-03 NOTE — Op Note (Signed)
Preoperative diagnosis: Left testicular torsion  Postoperative diagnosis: Left testicular torsion  Procedure: Scrotal exploration with left orchiectomy and right orchidopexy  Surgeon: Micheal Bruins MD  Anesthesia: General  Complications: None  EBL: Minimal  Specimen: Left testis and spermatic cord  Disposition of specimen: Pathology  Intraoperative findings: Upon scrotal exploration, the left testis was noted to be nonviable.  Indication: Micheal Gross is a 30 year old gentleman who presented with 48 hours of significant left scrotal pain.  Ultrasound evaluation revealed evidence of left testicular torsion.  It was recommended that he proceed with urgent scrotal exploration for further evaluation with intraoperative findings to determine whether a left orchiectomy versus orchidopexy would be indicated.  Furthermore, he was recommended to undergo right orchidopexy to prevent contralateral torsion in the future.  The potential risks, complications, and expected recovery process were discussed in detail.  Informed consent was obtained.  He was advised of potential issues with future fertility and testosterone production.  Description of procedure: The patient was taken the operating room and a general anesthetic was administered.  He was given preoperative antibiotics, placed in the supine position, and prepped and draped in usual sterile fashion.  Next, a preoperative timeout was performed.  An incision was made in the median scrotal raphae and carried down over the left testis.  The dartos fascia was noted to be severely edematous.  The left testis was eventually able to be delivered and the tunica vaginalis was incised with a small amount of serosanguineous fluid surrounding the left testis.  The left testis was significantly edematous and appeared nonviable.  It was torsed.  It was subsequently detorsed and placed in a saline gauze.  Attention then turned to the contralateral side.  The  dartos fascia overlying the right testis was carried down through to the level of the tunica vaginalis.  The right testis was delivered from the surrounding dartos fascia that was moderately edematous on this side as well.  The tunica vaginalis was incised and the testis was delivered.  The appendix testis was removed with Bovie electrocautery.  This testis appeared normal without masses or other abnormalities.  Using three-point fixation with 4-0 Prolene sutures laterally, medially, and inferiorly, orchidopexy was performed allowing the testis to be placed back in the right hemiscrotum and fixed in place.  The dartos fascia was then closed with a 3-0 chromic suture.  Attention then returned to the left testis.  This again appeared to be clearly nonviable.  The spermatic cord was divided into 2 vascular packets and clamped with a Kelly clamp proximally and 2 Kelly clamps on the separate packets distally.  The spermatic cord was then sharply divided allowing removal of the left testis which was sent for pathologic specimen.  2-0 silk stick ties were then used to ligate the spermatic cord in separate packets as well as one 2-0 silk that was placed on the proximal portion of the spermatic cord.  Hemostasis appeared to be excellent.  The dartos fascia on the left side was then closed with a running 3-0 chromic suture.  Quarter percent Marcaine was then injected for local anesthesia.  The skin was then reapproximated with a 4-0 Monocryl vertical mattress suture closure.  Dermabond was applied to the skin.  The patient tolerated the procedure well without complications.  He was able to be awakened and transferred to the recovery unit in satisfactory condition.

## 2021-01-03 NOTE — H&P (Signed)
H&P  Chief Complaint: Left testicular torsion  History of Present Illness: Micheal Gross is a 30 year old gentleman who developed the acute onset of moderate to severe left sided testicular and groin pain while driving on Monday.  This persisted and was associated with left scrotal swelling.  He denied nausea or vomiting.  He denies prior similar episodes of pain.  He denies associated urinary symptoms.  His pain persisted resulting in him presenting to the [facility] ED tonight.  Scrotal ultrasound was performed and demonstrated no blood flow to the left testis consistent with left testicular torsion.  He is engaged to be married.  He does have one child but does plan to have more children.  Past Medical History:  Diagnosis Date   Hyperlipidemia    Prediabetes     Past Surgical History:  Procedure Laterality Date   NO PAST SURGERIES      Home Medications:  None.    Allergies: No Known Allergies  History reviewed. No pertinent family history.  Social History:  reports that he has never smoked. He has never used smokeless tobacco. He reports current alcohol use. He reports that he does not use drugs.  ROS: A complete review of systems was performed.  All systems are negative except for pertinent findings as noted.  Physical Exam:  Vital signs in last 24 hours: Temp:  [98.7 F (37.1 C)] 98.7 F (37.1 C) (06/08 2343) Pulse Rate:  [101] 101 (06/08 2343) Resp:  [18] 18 (06/08 2343) BP: (115)/(88) 115/88 (06/08 2343) SpO2:  [95 %] 95 % (06/08 2343) Weight:  [135.2 kg] 135.2 kg (06/09 0052) Constitutional:  Alert and oriented, No acute distress Cardiovascular: Regular rate and rhythm, No JVD Respiratory: Normal respiratory effort, Lungs clear bilaterally GI: Abdomen is soft, nontender, nondistended, no abdominal masses Genitourinary: No CVAT. Normal male phallus, there is significant left scrotal edema and his left testis is tender, normal right testis without masses or  tenderness. Lymphatic: No lymphadenopathy Neurologic: Grossly intact, no focal deficits Psychiatric: Normal mood and affect  Laboratory Data:  No results for input(s): WBC, HGB, HCT, PLT in the last 72 hours.  No results for input(s): NA, K, CL, GLUCOSE, BUN, CALCIUM, CREATININE in the last 72 hours.  Invalid input(s): CO3   No results found for this or any previous visit (from the past 24 hour(s)). No results found for this or any previous visit (from the past 240 hour(s)).  Renal Function: No results for input(s): CREATININE in the last 168 hours. CrCl cannot be calculated (Patient's most recent lab result is older than the maximum 21 days allowed.).  Radiologic Imaging: US SCROTUM W/DOPPLER  Result Date: 01/03/2021 CLINICAL DATA:  Scrotal swelling EXAM: SCROTAL ULTRASOUND DOPPLER ULTRASOUND OF THE TESTICLES TECHNIQUE: Complete ultrasound examination of the testicles, epididymis, and other scrotal structures was performed. Color and spectral Doppler ultrasound were also utilized to evaluate blood flow to the testicles. COMPARISON:  None. FINDINGS: Right testicle Measurements: 5.1 x 2.2 x 3.4 cm. The right testicle demonstrates normal parenchymal echogenicity and echotexture. No intratesticular masses or calcifications are seen. There is normal color flow arterial and venous vascularity identified. Left testicle Measurements: 4.7 x 3.5 x 3.3 cm. The left testicle demonstrates heterogeneous parenchymal echogenicity and is diffusely relatively hypoechoic when compared to the right testicle. No discrete intratesticular mass or calcification is seen. There is no identifiable color flow vascularity of the left testicle. Right epididymis:  Normal in size and appearance. Left epididymis: Normal in size and  appearance (see image # 42). The vascularity is not well assessed on this exam. Hydrocele:  None visualized on the right.  See below. Varicocele: While a discrete varicocele is not identified,  there is a fluid-filled dilated ovoid structure within the left hemiscrotum likely representing a dilated outflow vessel secondary to central obstruction due to torsion. Alternatively, this may represent a small hematoma within the a left hemiscrotum secondary to infarction. Pulsed Doppler interrogation of both testes demonstrates there is normal low resistance arterial and venous waveforms noted within the right testicle. There is no identifiable arterial or venous vascularity within the left testicle. IMPRESSION: Left testicular torsion. Heterogeneous parenchymal echogenicity may reflect developing infarction. Complex avoid fluid-filled structure within the left hemiscrotum representing either a dilated outflow vein or a possible scrotal hematoma. These results were called by telephone at the time of interpretation on 01/03/2021 at 12:39 am to provider Micheal Gross , who verbally acknowledged these results. Electronically Signed   By: Helyn Numbers MD   On: 01/03/2021 00:39    Impression/Assessment:  Left testicular torsion  Plan:  I have recommended that he proceed to the OR for scrotal exploration.  Considering that his pain began two days ago, he understands that it is highly likely that his left testis is not salvageable.  However, exploration would be required to confirm this. If not salvageable, he understands that left orchiectomy would be indicated to improve his pain symptoms.  If salvageable, he understands that orchidopexy would be indicated.  I also recommended right orchidopexy for prevention of contralateral torsion in the future. I discussed the potential benefits and risks of the procedure, side effects of the proposed treatment, the likelihood of the patient achieving the goals of the procedure, and any potential problems that might occur during the procedure or recuperation.  We specifically discussed the potential risk of infertility or hypogonadism associated with loss of a testicle.  He  give informed consent to proceed.  Micheal Gross 01/03/2021, 2:26 AM  Moody Bruins MD

## 2021-01-03 NOTE — Anesthesia Postprocedure Evaluation (Signed)
Anesthesia Post Note  Patient: Milad Bublitz  Procedure(s) Performed: SCROTAL EXPLORATION, RIGHT ORCHIOPEXY , LEFT ORCHIECTOMY (Bilateral)     Patient location during evaluation: PACU Anesthesia Type: General Level of consciousness: awake and alert Pain management: pain level controlled Vital Signs Assessment: post-procedure vital signs reviewed and stable Respiratory status: spontaneous breathing, nonlabored ventilation and respiratory function stable Cardiovascular status: blood pressure returned to baseline and stable Postop Assessment: no apparent nausea or vomiting Anesthetic complications: no   No notable events documented.  Last Vitals:  Vitals:   01/03/21 0545 01/03/21 0600  BP: 118/84 115/81  Pulse: 77 71  Resp: 11 10  Temp:  36.7 C  SpO2: 94% 95%    Last Pain:  Vitals:   01/03/21 0600  TempSrc:   PainSc: Asleep                 Liyana Suniga,W. EDMOND

## 2021-01-03 NOTE — ED Notes (Signed)
Last meal since 3pm 01/02/2021

## 2021-01-03 NOTE — Anesthesia Preprocedure Evaluation (Addendum)
Anesthesia Evaluation  Patient identified by MRN, date of birth, ID band Patient awake    Reviewed: Allergy & Precautions, H&P , NPO status , Patient's Chart, lab work & pertinent test results  Airway Mallampati: III  TM Distance: >3 FB Neck ROM: Full    Dental no notable dental hx. (+) Teeth Intact, Dental Advisory Given   Pulmonary neg pulmonary ROS,    Pulmonary exam normal breath sounds clear to auscultation       Cardiovascular negative cardio ROS   Rhythm:Regular Rate:Normal     Neuro/Psych negative neurological ROS  negative psych ROS   GI/Hepatic negative GI ROS, Neg liver ROS,   Endo/Other  Morbid obesity  Renal/GU negative Renal ROS  negative genitourinary   Musculoskeletal   Abdominal   Peds  Hematology negative hematology ROS (+)   Anesthesia Other Findings   Reproductive/Obstetrics negative OB ROS                            Anesthesia Physical Anesthesia Plan  ASA: II  Anesthesia Plan: General   Post-op Pain Management:    Induction: Intravenous  PONV Risk Score and Plan: 3 and Ondansetron, Dexamethasone and Midazolam  Airway Management Planned: Oral ETT  Additional Equipment:   Intra-op Plan:   Post-operative Plan: Extubation in OR  Informed Consent: I have reviewed the patients History and Physical, chart, labs and discussed the procedure including the risks, benefits and alternatives for the proposed anesthesia with the patient or authorized representative who has indicated his/her understanding and acceptance.     Dental advisory given  Plan Discussed with: CRNA  Anesthesia Plan Comments:         Anesthesia Quick Evaluation

## 2021-01-03 NOTE — Anesthesia Procedure Notes (Signed)
Procedure Name: Intubation Date/Time: 01/03/2021 3:20 AM Performed by: Terriah Reggio T, CRNA Pre-anesthesia Checklist: Patient identified, Emergency Drugs available, Suction available and Patient being monitored Patient Re-evaluated:Patient Re-evaluated prior to induction Oxygen Delivery Method: Circle system utilized Preoxygenation: Pre-oxygenation with 100% oxygen Induction Type: IV induction Ventilation: Mask ventilation without difficulty and Oral airway inserted - appropriate to patient size Laryngoscope Size: Mac and 4 Grade View: Grade I Tube type: Oral Tube size: 7.5 mm Number of attempts: 1 Airway Equipment and Method: Stylet and Oral airway Placement Confirmation: ETT inserted through vocal cords under direct vision, positive ETCO2 and breath sounds checked- equal and bilateral Secured at: 22 cm Tube secured with: Tape Dental Injury: Teeth and Oropharynx as per pre-operative assessment

## 2021-01-03 NOTE — Discharge Instructions (Signed)
Continue fluff dressing to scrotum for next 48 hrs and then ok to remove and begin showering.  Avoid bathing, swimming pools, etc until your follow up appointment.  Use an ice pack to scrotum intermittently as much as possible for the first 48-72 hrs.  Use your pain medication as needed up to every 6 hours for pain.  It is ok to substitute Tylenol if you feel you do not need the stronger pain medication.  Avoid lifting, straining, or strenuous exercise as discussed for a minimum of 2 weeks or longer if needed until all pain is resolved.

## 2021-01-03 NOTE — ED Provider Notes (Signed)
Emergency Department Provider Note   I have reviewed the triage vital signs and the nursing notes.   HISTORY  Chief Complaint Testicle Pain   HPI Micheal Gross is a 30 y.o. male presents to the emergency department with left testicle pain and progressively worsening swelling over the past 2 days.  Patient denies any injury to the area.  He has had progressively worsening symptoms which ultimately prompted ED evaluation.  No fevers or chills.  No dysuria, hesitancy, urgency.  No pain into the abdomen or back.  Pain is moderate to severe and worse with touching the area.   Past Medical History:  Diagnosis Date   Hyperlipidemia    Prediabetes     Patient Active Problem List   Diagnosis Date Noted   Rib pain on left side 01/05/2019   Radiculopathy 11/10/2018   Arthralgia 08/09/2018   Well adult exam 07/13/2018   Dizziness 07/13/2018   Cough 05/19/2018   Generalized abdominal pain 09/22/2017    Past Surgical History:  Procedure Laterality Date   NO PAST SURGERIES      Allergies Patient has no known allergies.  History reviewed. No pertinent family history.  Social History Social History   Tobacco Use   Smoking status: Never   Smokeless tobacco: Never  Vaping Use   Vaping Use: Never used  Substance Use Topics   Alcohol use: Yes    Comment: occasional   Drug use: No    Review of Systems  Constitutional: No fever/chills Eyes: No visual changes. ENT: No sore throat. Cardiovascular: Denies chest pain. Respiratory: Denies shortness of breath. Gastrointestinal: No abdominal pain.  No nausea, no vomiting.  No diarrhea.  No constipation. Genitourinary: Negative for dysuria. Positive left testicle pain/swelling.  Musculoskeletal: Negative for back pain. Skin: Negative for rash. Neurological: Negative for headaches, focal weakness or  10-point ROS otherwise negative.  ____________________________________________   PHYSICAL EXAM:  VITAL SIGNS: ED  Triage Vitals  Enc Vitals Group     BP 01/02/21 2343 115/88     Pulse Rate 01/02/21 2343 (!) 101     Resp 01/02/21 2343 18     Temp 01/02/21 2343 98.7 F (37.1 C)     Temp Source 01/02/21 2343 Oral     SpO2 01/02/21 2343 95 %   Constitutional: Alert and oriented. Well appearing and in no acute distress. Eyes: Conjunctivae are normal.  Head: Atraumatic. Nose: No congestion/rhinnorhea. Mouth/Throat: Mucous membranes are moist.   Neck: No stridor.   Cardiovascular: Tachycardia. Good peripheral circulation. Grossly normal heart sounds.   Respiratory: Normal respiratory effort.  No retractions. Lungs CTAB. Gastrointestinal: Soft and nontender. No distention.  Genitourinary: Exam performed with patient's verbal consent.  He has diffuse swelling of the scrotum with firm, enlarged testicle on the left.  No scrotal cellulitis.  No skin breakdown or abscess. Musculoskeletal: No gross deformities of extremities. Neurologic:  Normal speech and language.  Skin:  Skin is warm, dry and intact. No rash noted.  ____________________________________________   LABS (all labs ordered are listed, but only abnormal results are displayed)  Labs Reviewed  RESP PANEL BY RT-PCR (FLU A&B, COVID) ARPGX2  URINALYSIS, ROUTINE W REFLEX MICROSCOPIC  COMPREHENSIVE METABOLIC PANEL  CBC WITH DIFFERENTIAL/PLATELET  PROTIME-INR  GC/CHLAMYDIA PROBE AMP ([facility]) NOT AT [facility]   ____________________________________________  RADIOLOGY  US SCROTUM W/DOPPLER  Result Date: 01/03/2021 CLINICAL DATA:  Scrotal swelling EXAM: SCROTAL ULTRASOUND DOPPLER ULTRASOUND OF THE TESTICLES TECHNIQUE: Complete ultrasound examination of the testicles, epididymis, and other scrotal  structures was performed. Color and spectral Doppler ultrasound were also utilized to evaluate blood flow to the testicles. COMPARISON:  None. FINDINGS: Right testicle Measurements: 5.1 x 2.2 x 3.4 cm. The right testicle demonstrates normal parenchymal  echogenicity and echotexture. No intratesticular masses or calcifications are seen. There is normal color flow arterial and venous vascularity identified. Left testicle Measurements: 4.7 x 3.5 x 3.3 cm. The left testicle demonstrates heterogeneous parenchymal echogenicity and is diffusely relatively hypoechoic when compared to the right testicle. No discrete intratesticular mass or calcification is seen. There is no identifiable color flow vascularity of the left testicle. Right epididymis:  Normal in size and appearance. Left epididymis: Normal in size and appearance (see image # 42). The vascularity is not well assessed on this exam. Hydrocele:  None visualized on the right.  See below. Varicocele: While a discrete varicocele is not identified, there is a fluid-filled dilated ovoid structure within the left hemiscrotum likely representing a dilated outflow vessel secondary to central obstruction due to torsion. Alternatively, this may represent a small hematoma within the a left hemiscrotum secondary to infarction. Pulsed Doppler interrogation of both testes demonstrates there is normal low resistance arterial and venous waveforms noted within the right testicle. There is no identifiable arterial or venous vascularity within the left testicle. IMPRESSION: Left testicular torsion. Heterogeneous parenchymal echogenicity may reflect developing infarction. Complex avoid fluid-filled structure within the left hemiscrotum representing either a dilated outflow vein or a possible scrotal hematoma. These results were called by telephone at the time of interpretation on 01/03/2021 at 12:39 am to provider [facility] , who verbally acknowledged these results. Electronically Signed   By: Helyn Numbers MD   On: 01/03/2021 00:39    ____________________________________________   PROCEDURES  Procedure(s) performed:   Procedures  CRITICAL CARE Performed by: Maia Plan Total critical care time: 35 minutes Critical  care time was exclusive of separately billable procedures and treating other patients. Critical care was necessary to treat or prevent imminent or life-threatening deterioration. Critical care was time spent personally by me on the following activities: development of treatment plan with patient and/or surrogate as well as nursing, discussions with consultants, evaluation of patient's response to treatment, examination of patient, obtaining history from patient or surrogate, ordering and performing treatments and interventions, ordering and review of laboratory studies, ordering and review of radiographic studies, pulse oximetry and re-evaluation of patient's condition.  Alona Bene, MD Emergency Medicine  ____________________________________________   INITIAL IMPRESSION / ASSESSMENT AND PLAN / ED COURSE  Pertinent labs & imaging results that were available during my care of the patient were reviewed by me and considered in my medical decision making (see chart for details).   Patient presents to the emergency department 2 days of testicle pain and swelling.  Concern clinically for torsion.  Orchitis, epididymitis, scrotal abscess, hematoma also considered.  Patient immediately roomed and taken for testicular ultrasound.   12:41 AM  Called by radiology.  Patient has evidence of testicular torsion.  The findings appear relatively late presenting.  We will consult urology.   Discussed patient's case with Urology, Dr. Laverle Patter to request admission. Patient and family (if present) updated with plan. Care transferred to Urology service.  Labs and COVID swab sent immediately but at the start of Epic downtime.  I reviewed all nursing notes, vitals, pertinent old records, EKGs, labs, imaging (as available).  ____________________________________________  FINAL CLINICAL IMPRESSION(S) / ED DIAGNOSES  Final diagnoses:  Swelling  Torsion of left testicle  Note:  This document was prepared  using Dragon voice recognition software and may include unintentional dictation errors.  Alona Bene, MD, [facility] Emergency Medicine   Jessicia Napolitano, Arlyss Repress, MD 01/03/21 0230

## 2021-01-04 ENCOUNTER — Encounter (HOSPITAL_COMMUNITY): Payer: Self-pay | Admitting: Urology

## 2021-01-04 LAB — SURGICAL PATHOLOGY

## 2021-05-13 ENCOUNTER — Other Ambulatory Visit: Payer: Self-pay | Admitting: Registered Nurse

## 2021-05-13 DIAGNOSIS — R0981 Nasal congestion: Secondary | ICD-10-CM

## 2021-07-21 ENCOUNTER — Encounter: Payer: Self-pay | Admitting: Registered Nurse

## 2021-09-11 ENCOUNTER — Ambulatory Visit: Payer: BC Managed Care – PPO | Admitting: Family Medicine

## 2021-09-11 ENCOUNTER — Other Ambulatory Visit: Payer: Self-pay

## 2021-09-11 ENCOUNTER — Encounter: Payer: Self-pay | Admitting: Family Medicine

## 2021-09-11 VITALS — BP 124/76 | HR 85 | Temp 98.7°F | Ht 73.0 in | Wt 320.4 lb

## 2021-09-11 DIAGNOSIS — J339 Nasal polyp, unspecified: Secondary | ICD-10-CM | POA: Insufficient documentation

## 2021-09-11 DIAGNOSIS — J0100 Acute maxillary sinusitis, unspecified: Secondary | ICD-10-CM | POA: Diagnosis not present

## 2021-09-11 DIAGNOSIS — G8929 Other chronic pain: Secondary | ICD-10-CM | POA: Diagnosis not present

## 2021-09-11 DIAGNOSIS — M545 Low back pain, unspecified: Secondary | ICD-10-CM | POA: Insufficient documentation

## 2021-09-11 DIAGNOSIS — M5442 Lumbago with sciatica, left side: Secondary | ICD-10-CM | POA: Diagnosis not present

## 2021-09-11 DIAGNOSIS — J309 Allergic rhinitis, unspecified: Secondary | ICD-10-CM | POA: Diagnosis not present

## 2021-09-11 MED ORDER — AMOXICILLIN-POT CLAVULANATE 875-125 MG PO TABS: 1.0000 | ORAL_TABLET | Freq: Two times a day (BID) | ORAL | 0 refills | Status: DC

## 2021-09-11 MED ORDER — CYCLOBENZAPRINE HCL 5 MG PO TABS
5.0000 mg | ORAL_TABLET | Freq: Three times a day (TID) | ORAL | 1 refills | Status: DC | PRN
Start: 2021-09-11 — End: 2024-02-23

## 2021-09-11 NOTE — Patient Instructions (Signed)
Use a daily antihistamine, such as cetirizine (Zyrtec) or loratidine (Claritin)

## 2021-09-11 NOTE — Progress Notes (Signed)
[facility] [address] [place] [address] Dept: [phone] Dept Fax: [phone]  Transfer of Care Office Visit  Subjective:    Patient ID: Micheal Gross, male    DOB: 07/23/1991, 31 y.o..   MRN: [id number]  Chief Complaint  Patient presents with   Establish Care    NP- establish care. C/o having sinus drainage/congestion x 2 months.  Also having back tightness.    Has taken Sudafed, Flonase, Fluticasone, Asprin, and Naproxen      History of Present Illness:  Patient is in today to establish care. Micheal Gross was born in [place]. He attended [employer], majoring in Audiological scientist. He now works as an Airline pilot for [employer]. He has a fianc. She has a 48 year-old son. Micheal Gross denies any tobacco or drug use and only drinks alcohol rarely.  Micheal Gross has a history of recurrent nasal congestion. He notes that he was previously seen by Dr. Ezzard Standing who found him to have a large right nasal polyp and a left septal deviation. He had been recommended to have a CT scan to assess and Dr. Ezzard Standing had planned a polypectomy. Micheal Gross never had the CT performed and Dr. Ezzard Standing is now retired. Over the past 2 months, Micheal Gross has had significant nasal congestion and profuse rhinorrhea. He is using Flonase 2 sprays each nare daily. He has also been using some Sudafed. He admits to some sneezing and dry eye symptoms. He is having some pain over the left maxillary sinus and admits to left maxillary tooth achiness and increased left maxillary pain when bending over.  Micheal Gross has a history of low back pain. He states he often feels tightness in the lower back. He was previously prescribed cyclobenzaprine for this. He does get some sciatic pain down the left buttocks and leg. He denies any numbness or weakness.  Past Medical History: Patient Active Problem List   Diagnosis Date Noted   Allergic rhinitis 09/11/2021   Nasal  polyp 09/11/2021   Low back pain 09/11/2021   Past Surgical History:  Procedure Laterality Date   ORCHIOPEXY Bilateral 01/03/2021   Procedure: SCROTAL EXPLORATION, RIGHT ORCHIOPEXY , LEFT ORCHIECTOMY;  Gross: Heloise Purpura, MD;  Location: [facility] OR;  Service: Urology;  Laterality: Bilateral;   Family History  Problem Relation Age of Onset   Hypertension Mother    Dementia Maternal Grandmother    Outpatient Medications Prior to Visit  Medication Sig Dispense Refill   HYDROcodone-acetaminophen (NORCO/VICODIN) 5-325 MG tablet Take 1 tablet by mouth every 4 (four) hours as needed for moderate pain. 15 tablet 0   Naphazoline HCl (CLEAR EYES OP) Place 1 drop into the left eye daily as needed (dry eye).     No facility-administered medications prior to visit.   No Known Allergies    Objective:   Today's Vitals   09/11/21 1535  BP: 124/76  Pulse: 85  Temp: 98.7 F (37.1 C)  TempSrc: Temporal  SpO2: 96%  Weight: (!) 320 lb 6.4 oz (145.3 kg)  Height: 6\' 1"  (1.854 m)   Body mass index is 42.27 kg/m.   General: Well developed, well nourished. No acute distress. HEENT: Normocephalic, non-traumatic. Conjunctiva clear. External ears normal. EAC normal. Right TM is   bulging, but no redness.  Nasal mucosa is swollen, esp. on the right with very narrowed nasal passages   and profuse purulent rhinorrhea. Tenderness with percussion over the right maxillary sinus. Mucous   membranes moist.  Oropharynx clear. Good dentition. Neck: Supple. No lymphadenopathy. No thyromegaly. Lungs: Clear to auscultation bilaterally. No wheezing, rales or rhonchi. Back: Straight. No tenderness on palpation. Extremities: Full ROM. Strength 5/5. Neuro: Normal sensation and DTR 2+ bilaterally. Psych: Alert and oriented. Normal mood and affect.  Health Maintenance Due  Topic Date Due   HIV Screening  Never done   Hepatitis C Screening  Never done   TETANUS/TDAP  Never done   Lab Results Last lipids Lab  Results  Component Value Date   CHOL 203 (H) 10/31/2019   HDL 38 (L) 10/31/2019   LDLCALC 137 (H) 10/31/2019   TRIG 153 (H) 10/31/2019   CHOLHDL 5.3 (H) 10/31/2019   Assessment & Plan:   1. Acute maxillary sinusitis, recurrence not specified Micheal Gross has evidence of a right maxillary sinusitis. I will prescribe a course of Augmentin. I would like ot check him back in 2 weeks to reassess. If symptoms persist at that time, would consider a course of prednisone and/or a referral to ENT to address his nasal polyp.  - amoxicillin-clavulanate (AUGMENTIN) 875-125 MG tablet; Take 1 tablet by mouth 2 (two) times daily.  Dispense: 20 tablet; Refill: 0  2. Allergic rhinitis, unspecified seasonality, unspecified trigger Continue Flonase. I recommend he add an oral 2nd generation antihistamine daily.  3. Chronic midline low back pain with left-sided sciatica Back exam is consistent with a MSK pain of the lower back. Iw ill prescribe some Flexeril for ongoing use. - cyclobenzaprine (FLEXERIL) 5 MG tablet; Take 1 tablet (5 mg total) by mouth 3 (three) times daily as needed for muscle spasms.  Dispense: 30 tablet; Refill: 1  Loyola Mast, MD

## 2021-09-25 ENCOUNTER — Other Ambulatory Visit: Payer: Self-pay

## 2021-09-26 ENCOUNTER — Ambulatory Visit: Payer: BC Managed Care – PPO | Admitting: Family Medicine

## 2021-09-26 ENCOUNTER — Encounter: Payer: Self-pay | Admitting: Family Medicine

## 2021-09-26 VITALS — BP 120/76 | HR 82 | Temp 98.0°F | Ht 73.0 in | Wt 318.8 lb

## 2021-09-26 DIAGNOSIS — Z23 Encounter for immunization: Secondary | ICD-10-CM

## 2021-09-26 DIAGNOSIS — E785 Hyperlipidemia, unspecified: Secondary | ICD-10-CM | POA: Diagnosis not present

## 2021-09-26 DIAGNOSIS — J309 Allergic rhinitis, unspecified: Secondary | ICD-10-CM

## 2021-09-26 DIAGNOSIS — Z1159 Encounter for screening for other viral diseases: Secondary | ICD-10-CM

## 2021-09-26 DIAGNOSIS — R7303 Prediabetes: Secondary | ICD-10-CM

## 2021-09-26 DIAGNOSIS — J339 Nasal polyp, unspecified: Secondary | ICD-10-CM | POA: Diagnosis not present

## 2021-09-26 DIAGNOSIS — Z114 Encounter for screening for human immunodeficiency virus [HIV]: Secondary | ICD-10-CM

## 2021-09-26 LAB — LIPID PANEL
Cholesterol: 185 mg/dL (ref 0–200)
HDL: 32.4 mg/dL — ABNORMAL LOW (ref >39.00)
LDL Cholesterol: 124 mg/dL — ABNORMAL HIGH (ref 0–99)
NonHDL: 152.22
Total CHOL/HDL Ratio: 6
Triglycerides: 143 mg/dL (ref 0.0–149.0)
VLDL: 28.6 mg/dL (ref 0.0–40.0)

## 2021-09-26 LAB — HEMOGLOBIN A1C: Hgb A1c MFr Bld: 6.4 % (ref 4.6–6.5)

## 2021-09-26 NOTE — Progress Notes (Signed)
?[facility] ?[facility] ?[address] ?[place] [address] ?Dept: [phone] ?Dept Fax: [phone] ? ?Office Visit ? ?Subjective:  ? ? Patient ID: Micheal Gross, male    DOB: June 30, 1991, 31 y.o..   MRN: [id number] ? ?Chief Complaint  ?Patient presents with  ? Follow-up  ?  2 week f/u. Fasting today. C/o still having a lingering cough   ? ? ?History of Present Illness: ? ?Patient is in today for reassessment of his recent sinusitis. He was treated with a course of Augmentin. He found his symptoms did improve by the 3rd day of the antibiotics. He now feels this is resolved. He has some mild lingering cough, but is otherwise doing well. ? ?Mr. Micheal Gross has a history of nasal polyps. He was previously seen by Dr. Ezzard Standing who found him to have a large right nasal polyp and a left septal deviation. He had been recommended to have a CT scan to assess and Dr. Ezzard Standing had planned a polypectomy. [name] never had the CT performed and Dr. Ezzard Standing is now retired. ? ?Micheal Gross has a history of borderline hyperlipidemia and prediabetes. ? ?Past Medical History: ?Patient Active Problem List  ? Diagnosis Date Noted  ? Prediabetes 09/26/2021  ? Borderline hyperlipidemia 09/26/2021  ? Allergic rhinitis 09/11/2021  ? Nasal polyp 09/11/2021  ? Low back pain 09/11/2021  ? ?Past Surgical History:  ?Procedure Laterality Date  ? ORCHIOPEXY Bilateral 01/03/2021  ? Procedure: SCROTAL EXPLORATION, RIGHT ORCHIOPEXY , LEFT ORCHIECTOMY;  Surgeon: Micheal Purpura, MD;  Location: [facility] OR;  Service: Urology;  Laterality: Bilateral;  ? ?Family History  ?Problem Relation Age of Onset  ? Hypertension Mother   ? Dementia Maternal Grandmother   ? ?Outpatient Medications Prior to Visit  ?Medication Sig Dispense Refill  ? cyclobenzaprine (FLEXERIL) 5 MG tablet Take 1 tablet (5 mg total) by mouth 3 (three) times daily as needed for muscle spasms. 30 tablet 1  ? fluticasone (FLONASE) 50 MCG/ACT nasal  spray Place 2 sprays into both nostrils daily.    ? amoxicillin-clavulanate (AUGMENTIN) 875-125 MG tablet Take 1 tablet by mouth 2 (two) times daily. 20 tablet 0  ? ?No facility-administered medications prior to visit.  ? ?No Known Allergies ?   ?Objective:  ? ?Today's Vitals  ? 09/26/21 0805  ?BP: 120/76  ?Pulse: 82  ?Temp: 98 ?F (36.7 ?C)  ?TempSrc: Temporal  ?SpO2: 95%  ?Weight: (!) 318 lb 12.8 oz (144.6 kg)  ?Height: 6\' 1"  (1.854 m)  ? ?Body mass index is 42.06 kg/m?.  ? ?General: Well developed, well nourished. No acute distress. ?HEENT: Normocephalic, non-traumatic. Conjunctiva clear. External ears normal. EAC and  ? TMs normal bilaterally. Mucous membranes moist. Oropharynx clear. Good dentition. ?Lungs: Clear to auscultation bilaterally. No wheezing, rales or rhonchi. ?Psych: Alert and oriented. Normal mood and affect. ? ?Health Maintenance Due  ?Topic Date Due  ? Hepatitis C Screening  Never done  ? TETANUS/TDAP  Never done  ?   ?Assessment & Plan:  ? ?1. Allergic rhinitis, unspecified seasonality, unspecified trigger ?Sinusitis is improved. Mr. Bones will continue his Flonase spray. ? ?2. 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 Allene Gross to complete assessment a determine if he might need surgery. ? ?- Ambulatory referral to ENT ? ?3. Prediabetes ?Will reassess his A1c. ? ?- Hemoglobin A1c ? ?4. Borderline hyperlipidemia ?Will reassess his lipids. ? ?-  Lipid panel ? ?5. Encounter for hepatitis C screening test for low risk patient ? ?- HCV Ab w Reflex to Quant PCR ? ?6. Screening for HIV (human immunodeficiency virus) ? ?- HIV Antibody (routine testing w rflx) ? ?Return in about 1 year (around 09/27/2022) for Reassessment.  ? ?Micheal Mast, MD ?

## 2021-09-26 NOTE — Addendum Note (Signed)
Addended by: Konrad Saha on: 09/26/2021 08:40 AM ? ? Modules accepted: Orders ? ?

## 2021-09-27 LAB — HCV AB W REFLEX TO QUANT PCR: HCV Ab: NONREACTIVE

## 2021-09-27 LAB — HIV ANTIBODY (ROUTINE TESTING W REFLEX): HIV 1&2 Ab, 4th Generation: NONREACTIVE

## 2021-09-27 LAB — HCV INTERPRETATION

## 2022-04-12 ENCOUNTER — Encounter: Payer: Self-pay | Admitting: Family Medicine

## 2022-12-21 IMAGING — US US SCROTUM W/ DOPPLER COMPLETE
1 series · 13 of 25 positions shown · non-contrast
Comparison: None.

CLINICAL DATA: Scrotal swelling

EXAM:
SCROTAL ULTRASOUND
DOPPLER ULTRASOUND OF THE TESTICLES
TECHNIQUE: Complete ultrasound examination of the testicles, epididymis, and
other scrotal structures was performed. Color and spectral Doppler
ultrasound were also utilized to evaluate blood flow to the
testicles.

[Series 1: us scrotum w/doppler · 58 acquisitions, 13 frames shown]
[im 1/58]
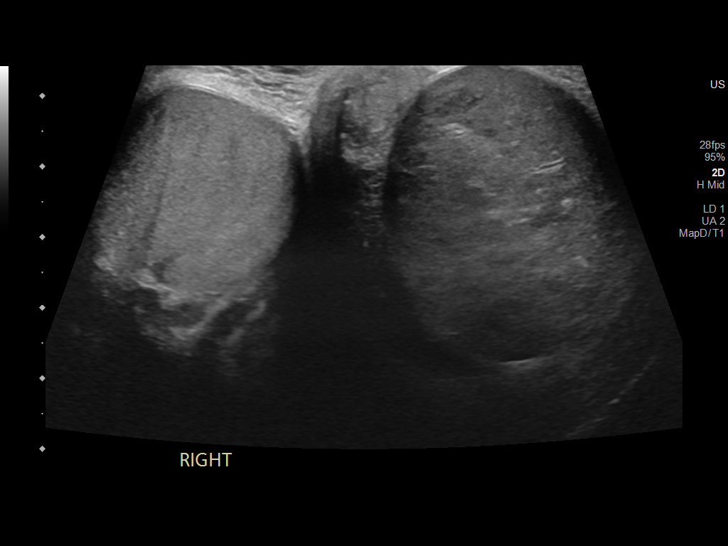
[im 5/58]
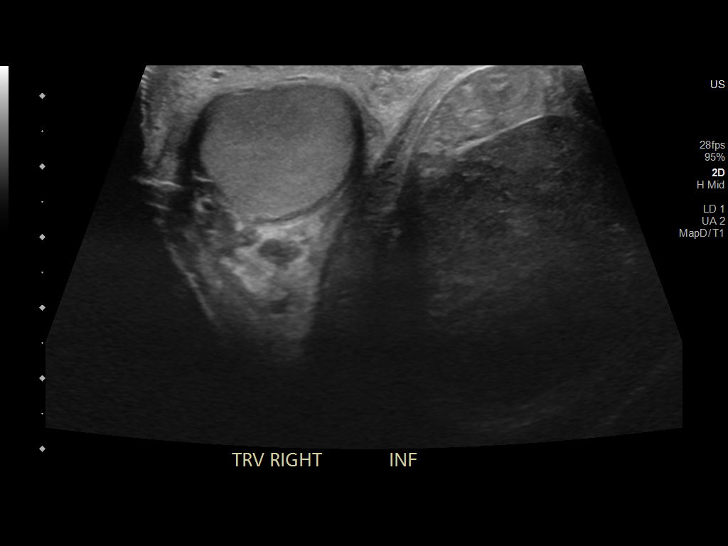
[im 10/58]
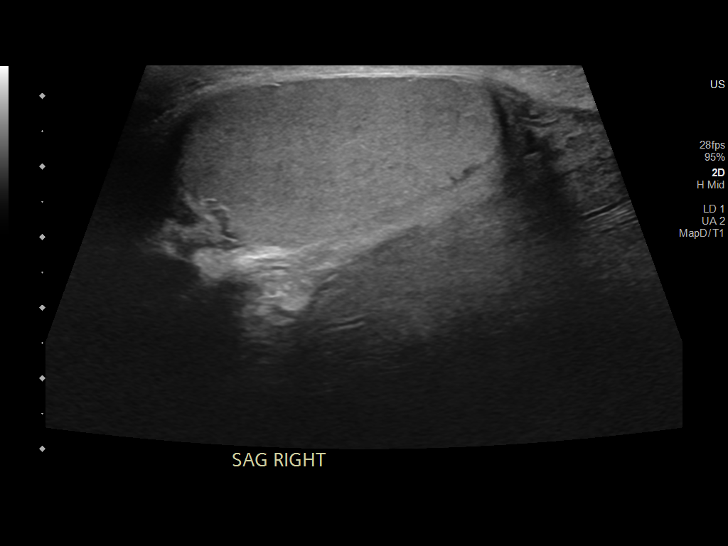
[im 15/58]
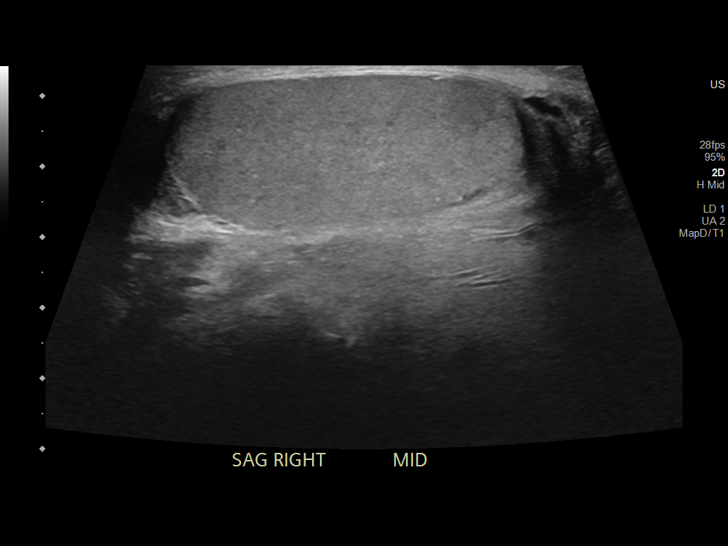
[im 20/58]
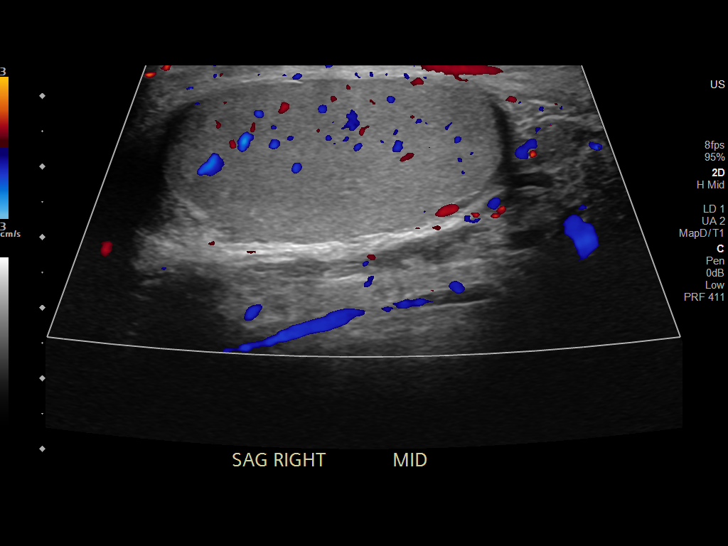
[im 24/58]
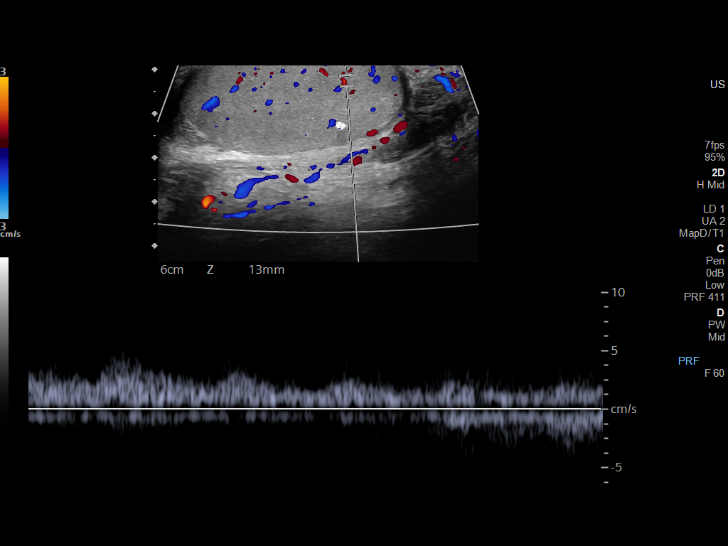
[im 29/58]
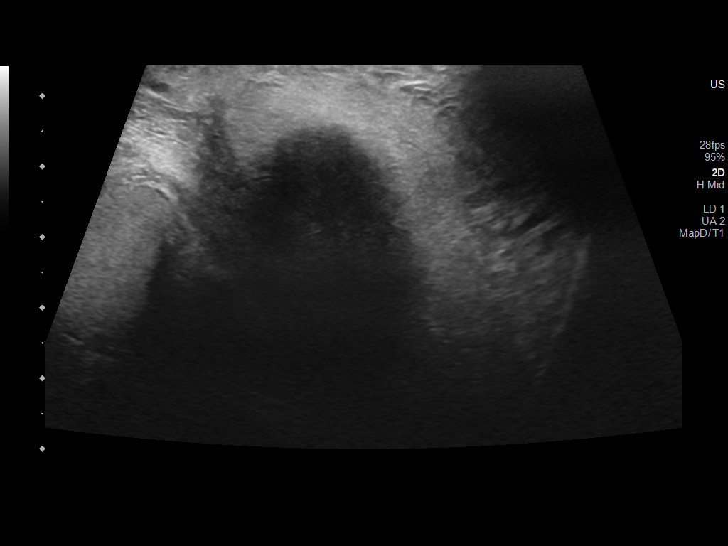
[im 34/58]
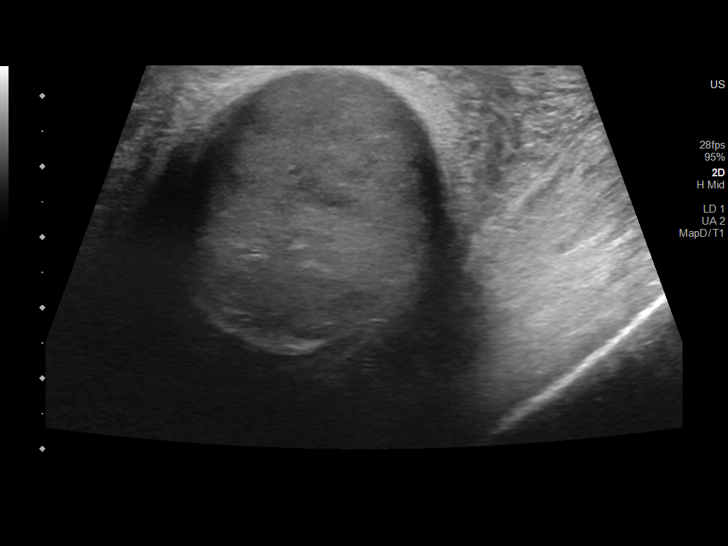
[im 39/58]
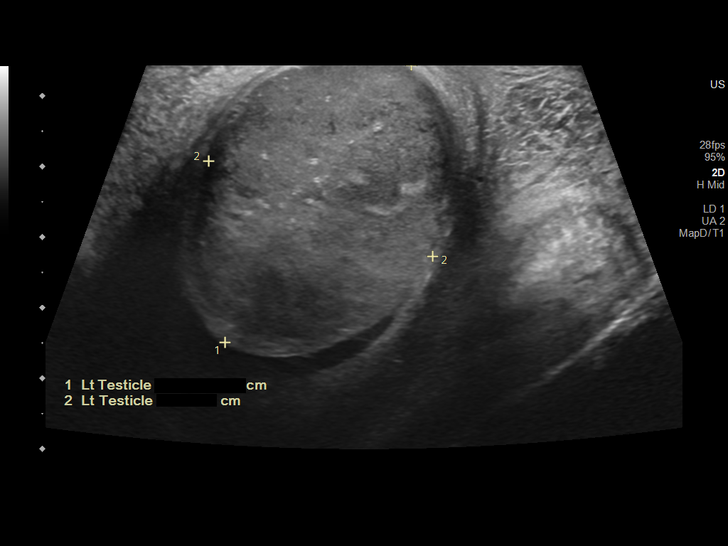
[im 43/58]
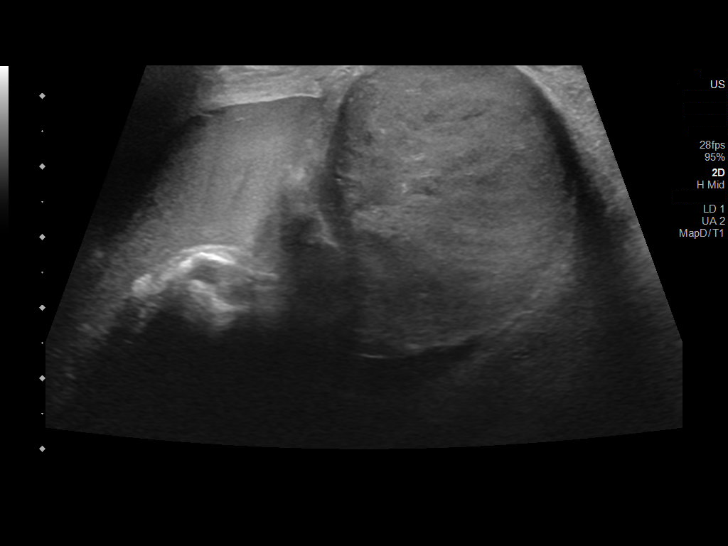
[im 48/58]
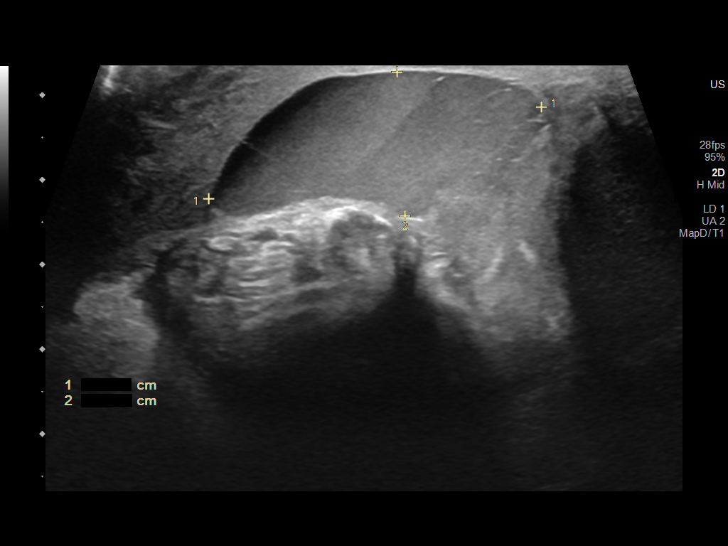
[im 53/58]
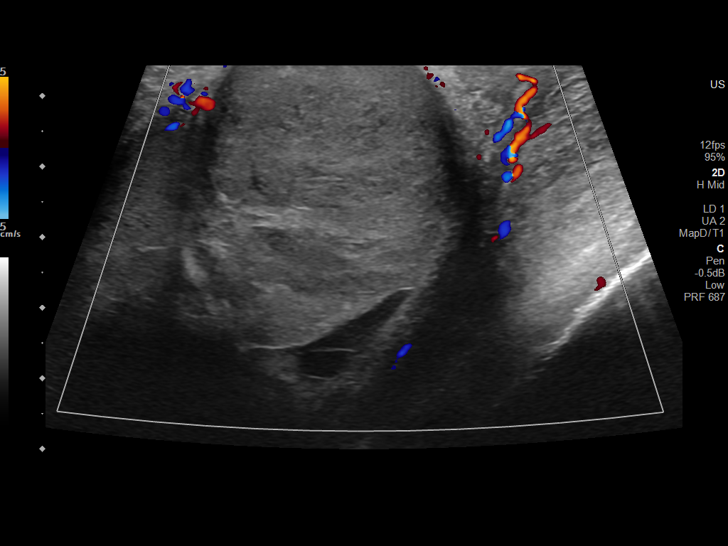
[im 58/58]
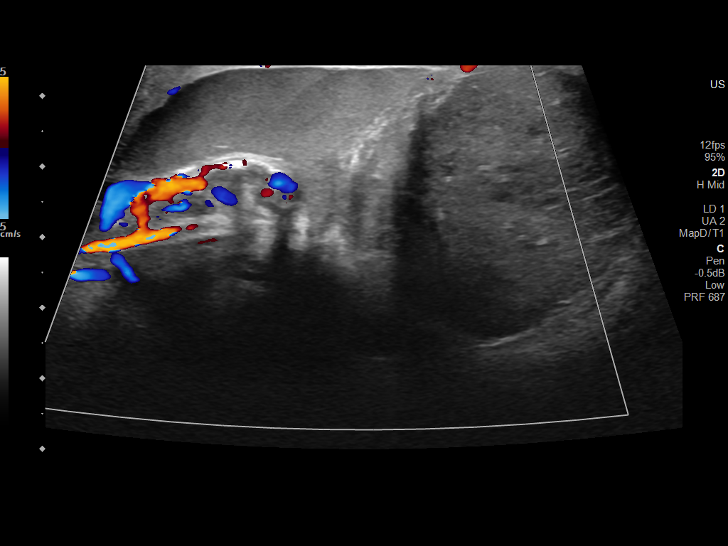

[13 of 25 positions shown; findings below may reference images not displayed]

FINDINGS: Right testicle

Measurements: 5.1 x 2.2 x 3.4 cm. The right testicle demonstrates
normal parenchymal echogenicity and echotexture. No intratesticular
masses or calcifications are seen. There is normal color flow
arterial and venous vascularity identified.

Left testicle

Measurements: 4.7 x 3.5 x 3.3 cm. The left testicle demonstrates
heterogeneous parenchymal echogenicity and is diffusely relatively
hypoechoic when compared to the right testicle. No discrete
intratesticular mass or calcification is seen. There is no
identifiable color flow vascularity of the left testicle.

Right epididymis:  Normal in size and appearance.

Left epididymis: Normal in size and appearance (see image # 42). The
vascularity is not well assessed on this exam.

Hydrocele:  None visualized on the right.  See below.

Varicocele: While a discrete varicocele is not identified, there is
a fluid-filled dilated ovoid structure within the left hemiscrotum
likely representing a dilated outflow vessel secondary to central
obstruction due to torsion. Alternatively, this may represent a
small hematoma within the a left hemiscrotum secondary to
infarction.

Pulsed Doppler interrogation of both testes demonstrates there is
normal low resistance arterial and venous waveforms noted within the
right testicle. There is no identifiable arterial or venous
vascularity within the left testicle.
IMPRESSION: Left testicular torsion. Heterogeneous parenchymal echogenicity may
reflect developing infarction.

Complex avoid fluid-filled structure within the left hemiscrotum
representing either a dilated outflow vein or a possible scrotal
hematoma.

These results were called by telephone at the time of interpretation
on 01/03/2021 at [DATE] to provider ALEK MAJANO , who verbally
acknowledged these results.

## 2023-02-18 ENCOUNTER — Ambulatory Visit: Payer: BC Managed Care – PPO | Admitting: Family Medicine

## 2023-02-18 VITALS — BP 132/76 | HR 94 | Temp 98.1°F | Ht 73.0 in | Wt 333.2 lb

## 2023-02-18 DIAGNOSIS — J309 Allergic rhinitis, unspecified: Secondary | ICD-10-CM

## 2023-02-18 DIAGNOSIS — R7303 Prediabetes: Secondary | ICD-10-CM

## 2023-02-18 DIAGNOSIS — R42 Dizziness and giddiness: Secondary | ICD-10-CM

## 2023-02-18 DIAGNOSIS — J339 Nasal polyp, unspecified: Secondary | ICD-10-CM

## 2023-02-18 LAB — BASIC METABOLIC PANEL
BUN: 10 mg/dL (ref 6–23)
CO2: 28 mEq/L (ref 19–32)
Calcium: 9.6 mg/dL (ref 8.4–10.5)
Chloride: 103 mEq/L (ref 96–112)
Creatinine, Ser: 1.04 mg/dL (ref 0.40–1.50)
GFR: 94.31 mL/min (ref >60.00)
Glucose, Bld: 99 mg/dL (ref 70–99)
Potassium: 4.3 mEq/L (ref 3.5–5.1)
Sodium: 140 mEq/L (ref 135–145)

## 2023-02-18 LAB — HEMOGLOBIN A1C: Hgb A1c MFr Bld: 6.3 % (ref 4.6–6.5)

## 2023-02-18 MED ORDER — FLUTICASONE PROPIONATE 50 MCG/ACT NA SUSP: 2.0000 | Freq: Every day | NASAL | 6 refills | Status: AC

## 2023-02-18 MED ORDER — MECLIZINE HCL 25 MG PO TABS: 25.0000 mg | ORAL_TABLET | Freq: Three times a day (TID) | ORAL | 0 refills | Status: DC | PRN

## 2023-02-18 NOTE — Assessment & Plan Note (Signed)
I will try referring him to ENT again. He had a plan for a CT scan and polypectomy with Dr. Ezzard Standing which has not happened. Hopefully, he can move ahead with this.

## 2023-02-18 NOTE — Assessment & Plan Note (Signed)
I will renew his fluticasone and do recommend he continue to use this daily.

## 2023-02-18 NOTE — Assessment & Plan Note (Signed)
Due for annual DM screening. No current symptoms of diabetes.

## 2023-02-18 NOTE — Progress Notes (Signed)
[facility] [address] [place] [address] Dept: [phone] Dept Fax: [phone]  Office Visit  Subjective:    Patient ID: Micheal Gross, male    DOB: Nov 20, 1990, 32 y.o..   MRN: [id number]  Chief Complaint  Patient presents with   Dizziness    C/o having some dizzy spells, pressure in back of neck/head everyday x 3-4 weeks.  Also having ear RT pain.      History of Present Illness:  Patient is in today complaining of a 1-week history of "dizziness". He notes this started after he had taken a dose of cyclobenzaprine related to a leg muscle spasm. He is having repeated bouts of vertigo, dysequilibrium, and/or lightheadedness since that time. These last for seconds when they occur. He had also had some headache around that time. [name] has a history of nasal polyps. He was previously seen by Dr. Ezzard Standing who found him to have a large right nasal polyp and a left septal deviation. He had been recommended to have a CT scan to assess and Dr. Ezzard Standing had planned a polypectomy. [name] never had the CT performed and Dr. Ezzard Standing is now retired. He has not been able to get in to see a new ENT to assess.  Past Medical History: Patient Active Problem List   Diagnosis Date Noted   Prediabetes 09/26/2021   Borderline hyperlipidemia 09/26/2021   Allergic rhinitis 09/11/2021   Nasal polyp 09/11/2021   Low back pain 09/11/2021   Past Surgical History:  Procedure Laterality Date   ORCHIOPEXY Bilateral 01/03/2021   Procedure: SCROTAL EXPLORATION, RIGHT ORCHIOPEXY , LEFT ORCHIECTOMY;  Surgeon: Heloise Purpura, MD;  Location: [facility] OR;  Service: Urology;  Laterality: Bilateral;   Family History  Problem Relation Age of Onset   Hypertension Mother    Dementia Maternal Grandmother    Outpatient Medications Prior to Visit  Medication Sig Dispense Refill   cyclobenzaprine (FLEXERIL) 5 MG tablet Take 1 tablet (5 mg total) by mouth  3 (three) times daily as needed for muscle spasms. 30 tablet 1   fluticasone (FLONASE) 50 MCG/ACT nasal spray Place 2 sprays into both nostrils daily.     No facility-administered medications prior to visit.   No Known Allergies   Objective:   Today's Vitals   02/18/23 1025  BP: 132/76  Pulse: 94  Temp: 98.1 F (36.7 C)  TempSrc: Temporal  Weight: (!) 333 lb 3.2 oz (151.1 kg)  Height: 6\' 1"  (1.854 m)   Body mass index is 43.96 kg/m.   General: Well developed, well nourished. No acute distress. HEENT: Normocephalic, non-traumatic. PERRL, EOMI. Conjunctiva clear. External ears normal. EAC and TMs   normal bilaterally. Nose with constricted nasal passages bilaterally. Mucous membranes moist. Oropharynx clear.   Good dentition. Neck: Supple. No lymphadenopathy. No thyromegaly. Lungs: Clear to auscultation bilaterally. No wheezing, rales or rhonchi. CV: RRR without murmurs or rubs. Pulses 2+ bilaterally. Psych: Alert and oriented. Normal mood and affect.  There are no preventive care reminders to display for this patient.    Assessment & Plan:   Problem List Items Addressed This Visit       Respiratory   Allergic rhinitis - Primary    I will renew his fluticasone and do recommend he continue to use this daily.      Relevant Medications   fluticasone (FLONASE) 50 MCG/ACT nasal spray   Other Relevant Orders   Ambulatory referral to ENT   Nasal polyp  I will try referring him to ENT again. He had a plan for a CT scan and polypectomy with Dr. Ezzard Standing which has not happened. Hopefully, he can move ahead with this.      Relevant Medications   fluticasone (FLONASE) 50 MCG/ACT nasal spray   Other Relevant Orders   Ambulatory referral to ENT     Other   Prediabetes    Due for annual DM screening. No current symptoms of diabetes.      Relevant Orders   Basic metabolic panel   Hemoglobin A1c   Other Visit Diagnoses     Dizziness       Etiology is likely secondary  to his chronic upper airway issues. I will porvide some meclizine for symptomatic relief.   Relevant Medications   meclizine (ANTIVERT) 25 MG tablet   Other Relevant Orders   Ambulatory referral to ENT       Return if symptoms worsen or fail to improve.   Loyola Mast, MD

## 2023-05-13 ENCOUNTER — Ambulatory Visit: Payer: BC Managed Care – PPO | Admitting: Family Medicine

## 2023-05-13 VITALS — BP 120/84 | HR 87 | Temp 97.7°F | Ht 73.0 in | Wt 334.0 lb

## 2023-05-13 DIAGNOSIS — R1032 Left lower quadrant pain: Secondary | ICD-10-CM | POA: Diagnosis not present

## 2023-05-13 MED ORDER — NAPROXEN 500 MG PO TABS: 500.0000 mg | ORAL_TABLET | Freq: Two times a day (BID) | ORAL | 0 refills | Status: DC

## 2023-05-13 NOTE — Assessment & Plan Note (Signed)
No sign of acute infectious process and no masses present. I recommend he ice this each evening. I will prescribe a course of Aleve for 7 days. If not improved, consider referral to urology.

## 2023-05-13 NOTE — Progress Notes (Signed)
  [facility] [address] [place] [address] Dept: [phone] Dept Fax: [phone]  Office Visit  Subjective:    Patient ID: Micheal Gross, male    DOB: 05/18/1991, 32 y.o..   MRN: [id number]  Chief Complaint  Patient presents with   Groin Pain    C/o having groin pain x 2 weeks.      History of Present Illness:  Patient is in today with a 2-week history of left groin pain. he notes this feels like a pinching sensation. he has not had any testicular or scrotal swelling. He has a past history of a testicular torsion with a right orchiectomy. He notes this does not feel like that did. He did try taking Aleve last night and felt he slept better, but he has still had some discomfort today. He denies any urethral discharge or dysuria.  Past Medical History: Patient Active Problem List   Diagnosis Date Noted   Prediabetes 09/26/2021   Borderline hyperlipidemia 09/26/2021   Allergic rhinitis 09/11/2021   Nasal polyp 09/11/2021   Low back pain 09/11/2021   Past Surgical History:  Procedure Laterality Date   ORCHIOPEXY Bilateral 01/03/2021   Procedure: SCROTAL EXPLORATION, RIGHT ORCHIOPEXY , LEFT ORCHIECTOMY;  Surgeon: Heloise Purpura, MD;  Location: [facility] OR;  Service: Urology;  Laterality: Bilateral;   Family History  Problem Relation Age of Onset   Hypertension Mother    Dementia Maternal Grandmother    Outpatient Medications Prior to Visit  Medication Sig Dispense Refill   cyclobenzaprine (FLEXERIL) 5 MG tablet Take 1 tablet (5 mg total) by mouth 3 (three) times daily as needed for muscle spasms. 30 tablet 1   fluticasone (FLONASE) 50 MCG/ACT nasal spray Place 2 sprays into both nostrils daily. 15.8 mL 6   meclizine (ANTIVERT) 25 MG tablet Take 1 tablet (25 mg total) by mouth 3 (three) times daily as needed for dizziness. 30 tablet 0   No facility-administered medications prior to visit.   No Known Allergies    Objective:   Today's Vitals   05/13/23 1521  BP: 120/84  Pulse: 87  Temp: 97.7 F (36.5 C)  TempSrc: Temporal  SpO2: 95%  Weight: (!) 334 lb (151.5 kg)  Height: 6\' 1"  (1.854 m)   Body mass index is 44.07 kg/m.   General: Well developed, well nourished. No acute distress. GU: Normal circumcised male. 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 The left testicle is of normal size and consistency. There eis no   swelling or tenderness of the epididymis. The area of tenderness is near the junction of the scrotal sack and the inner   thigh. There is no clear swelling, edema, or rash. Psych: Alert and oriented. Normal mood and affect.  There are no preventive care reminders to display for this patient.    Assessment & Plan:   Problem List Items Addressed This Visit       Other   Left inguinal pain - Primary    No sign of acute infectious process and no masses present. I recommend he ice this each evening. I will prescribe a course of Aleve for 7 days. If not improved, consider referral to urology.       Relevant Medications   naproxen (NAPROSYN) 500 MG tablet    Return in about 1 week (around 05/20/2023), or if symptoms worsen or fail to improve.   Loyola Mast, MD

## 2023-05-18 ENCOUNTER — Encounter: Payer: Self-pay | Admitting: Family Medicine

## 2023-05-18 DIAGNOSIS — R1032 Left lower quadrant pain: Secondary | ICD-10-CM

## 2023-05-18 DIAGNOSIS — R300 Dysuria: Secondary | ICD-10-CM

## 2023-05-19 ENCOUNTER — Other Ambulatory Visit: Payer: BC Managed Care – PPO

## 2023-05-19 DIAGNOSIS — R300 Dysuria: Secondary | ICD-10-CM

## 2023-05-19 DIAGNOSIS — R1032 Left lower quadrant pain: Secondary | ICD-10-CM

## 2023-05-21 LAB — URINALYSIS W MICROSCOPIC + REFLEX CULTURE

## 2023-05-22 LAB — CHLAMYDIA/GC NAA, CONFIRMATION
Chlamydia trachomatis, NAA: NEGATIVE
Neisseria gonorrhoeae, NAA: NEGATIVE

## 2023-05-25 ENCOUNTER — Other Ambulatory Visit (INDEPENDENT_AMBULATORY_CARE_PROVIDER_SITE_OTHER): Payer: BC Managed Care – PPO

## 2023-05-25 DIAGNOSIS — R300 Dysuria: Secondary | ICD-10-CM

## 2023-05-25 NOTE — Addendum Note (Signed)
Addended by: Loyola Mast on: 05/25/2023 01:05 PM   Modules accepted: Orders

## 2023-05-25 NOTE — Addendum Note (Signed)
Addended by: Tonita Phoenix on: 05/25/2023 04:41 PM   Modules accepted: Orders

## 2023-05-26 LAB — URINALYSIS, ROUTINE W REFLEX MICROSCOPIC
Bilirubin Urine: NEGATIVE
Hgb urine dipstick: NEGATIVE
Ketones, ur: NEGATIVE
Leukocytes,Ua: NEGATIVE
Nitrite: NEGATIVE
RBC / HPF: NONE SEEN (ref 0–?)
Specific Gravity, Urine: 1.025 (ref 1.000–1.030)
Total Protein, Urine: NEGATIVE
Urine Glucose: NEGATIVE
Urobilinogen, UA: 0.2 (ref 0.0–1.0)
pH: 6 (ref 5.0–8.0)

## 2023-05-26 LAB — URINE CULTURE
MICRO NUMBER:: 15651482
Result:: NO GROWTH
SPECIMEN QUALITY:: ADEQUATE

## 2023-05-27 ENCOUNTER — Encounter: Payer: Self-pay | Admitting: Family Medicine

## 2023-10-09 ENCOUNTER — Ambulatory Visit: Admitting: Family Medicine

## 2023-10-09 ENCOUNTER — Encounter: Payer: Self-pay | Admitting: Family Medicine

## 2023-10-09 VITALS — BP 130/84 | HR 98 | Temp 98.4°F | Ht 73.0 in | Wt 346.4 lb

## 2023-10-09 DIAGNOSIS — Z3169 Encounter for other general counseling and advice on procreation: Secondary | ICD-10-CM | POA: Diagnosis not present

## 2023-10-09 DIAGNOSIS — Z9079 Acquired absence of other genital organ(s): Secondary | ICD-10-CM | POA: Insufficient documentation

## 2023-10-09 DIAGNOSIS — M62830 Muscle spasm of back: Secondary | ICD-10-CM | POA: Diagnosis not present

## 2023-10-09 NOTE — Patient Instructions (Signed)
Male Infertility  Male infertility refers to a male's inability to get a male pregnant (get her to conceive) after a year of having sex regularly without using birth control. Both males and females can have fertility problems. What are the causes? This condition may be caused by: Problems with sperm. Infertility can result if a male is: Not producing enough sperm (low sperm count). Not producing enough sperm of normal size and shape (poor sperm morphology). Producing sperm that are not able to reach the egg (poor motility). Problems in a man's reproductive organs, such as: Enlarged veins (varicoceles), cysts (spermatoceles), or tumors of the testicles. Sexual dysfunction, including not being able to have an erection. Injury to the testicles. Having had a testicle that did not drop to its location in the scrotum (undescended testicle). A birth defect, such as not having the tubes that carry sperm (vas deferens). A lack of certain hormones. Certain medical conditions. These may include: Diabetes. Cancer and cancer treatments, such as chemotherapy or radiation. Klinefelter syndrome. This is an inherited genetic disorder. Thyroid problems, such as an underactive or overactive thyroid. Cystic fibrosis. Infections. Sexually transmitted diseases. Infertility can be linked to more than one cause. The cause of infertility in some men is not known. This is called unexplained infertility. What increases the risk? Age. A male's fertility declines with age. Using products that contain nicotine or tobacco. Excessive alcohol use. Obesity. Emotional stress. Exposure of the testicles to heat, such as frequent use of a hot tub or sauna. Using drugs such as anabolic steroids, cocaine, and marijuana. Being exposed to environmental toxins, such as pesticides and lead. What are the signs or symptoms? The main sign of infertility in males is the inability to get a male to conceive. How is this  diagnosed? This condition may be diagnosed using: Semen analysis tests to check sperm count, morphology, and motility. Blood tests to check hormone levels. Ultrasound of the scrotum to check for a varicocele or problems with the testicles. Transrectal ultrasound to check the prostate gland and to look for problems with the tubes that transport the semen (seminal vesicles). Taking a small sample of tissue from inside a testicle to look at it under a microscope (biopsy). Blood tests to check for genetic abnormalities (genetic testing). To be diagnosed with infertility, both partners will have a physical exam and tell their health care providers about their medical and sexual histories. Additional tests may be done. How is this treated? Treatment depends on the cause of infertility. Most cases of infertility in males are treated with medicine, surgery, or lifestyle changes. Treatment may include: Taking medicines. Medicines may: Correct hormone problems. Treat other health conditions. Treat infections. Treat sexual dysfunction. Having surgery. You may have surgery to: Remove blockages in the reproductive tract. Correct other structural problems of the reproductive tract. Making lifestyle changes. These changes may include: Reducing alcohol use or not drinking alcohol. Stopping use of drugs such as anabolic steroids, cocaine, and marijuana. Losing weight. Stopping smoking. Using stress reduction techniques. If other treatments do not work, your health care provider may recommend assisted reproductive technology (ART). ART refers to all treatments and procedures that combine eggs and sperm outside the body to try to help a couple conceive. ART may be effective for infertility caused by sperm problems including low sperm count and low motility. Examples of ART include intrauterine insemination and in vitro fertilization. Follow these instructions at home: Lifestyle If you drink alcohol, limit  how much you have to 0-2 drinks  a day. Do not use any products that contain nicotine or tobacco. These products include cigarettes, chewing tobacco, and vaping devices, such as e-cigarettes. If you need help quitting, ask your health care provider. Make changes to your diet if needed to lose weight or maintain a healthy weight. Work with your health care provider and a dietitian to set a weight-loss goal that is healthy and reasonable for you. Practice stress reduction techniques that work well for you, such as regular physical activity, meditation, or deep breathing. General instructions Take over-the-counter and prescription medicines only as told by your health care provider. Seek support from a counselor or support group to talk about your concerns related to infertility. Couples counseling may be helpful for you and your partner. Keep all follow-up visits. This is important. Contact a health care provider if you: Feel that stress is interfering with your life and relationships. Have side effects from treatments for infertility. Summary Male infertility refers to a male's inability to get a male pregnant (get her to conceive) after a year of having sex regularly without using birth control. To be diagnosed with infertility, both partners will have a physical exam and tell their health care providers about their medical and sexual histories. Seek support from a counselor or support group to talk about your concerns related to infertility. Couples counseling may be helpful for you and your partner. This information is not intended to replace advice given to you by your health care provider. Make sure you discuss any questions you have with your health care provider. Document Revised: 03/13/2021 Document Reviewed: 03/13/2021 Elsevier Patient Education  2024 [employer].

## 2023-10-09 NOTE — Progress Notes (Signed)
  [facility] [address] [place] [address] Dept: [phone] Dept Fax: [phone]  Office Visit  Subjective:    Patient ID: Micheal Gross, male    DOB: June 06, 1991, 33 y.o..   MRN: [id number]  Chief Complaint  Patient presents with   Follow-up    F/u to discuss fertility issues and back pain x 2 weeks.  Taking Tylenol    History of Present Illness:  Patient is in today to discuss infertility issues. Mr. Sahakian notes he and his wife have been trying to get pregnant for the past 1 year without success. He has a past history of testicular torsion. He underwent a left orchiectomy and right orchiopexy in 2022. His wife has had previous children.  Also, Mr. Hardage has had some recent mid-back pain. He notes he has had intermittent lwo back pain for many years. The recent issue has been mid thoracic.  Past Medical History: Patient Active Problem List   Diagnosis Date Noted   Left inguinal pain 05/13/2023   Prediabetes 09/26/2021   Borderline hyperlipidemia 09/26/2021   Allergic rhinitis 09/11/2021   Nasal polyp 09/11/2021   Low back pain 09/11/2021   Past Surgical History:  Procedure Laterality Date   ORCHIOPEXY Bilateral 01/03/2021   Procedure: SCROTAL EXPLORATION, RIGHT ORCHIOPEXY , LEFT ORCHIECTOMY;  Surgeon: Heloise Purpura, MD;  Location: [facility] OR;  Service: Urology;  Laterality: Bilateral;   Family History  Problem Relation Age of Onset   Hypertension Mother    Dementia Maternal Grandmother    Outpatient Medications Prior to Visit  Medication Sig Dispense Refill   fluticasone (FLONASE) 50 MCG/ACT nasal spray Place 2 sprays into both nostrils daily. 15.8 mL 6   cyclobenzaprine (FLEXERIL) 5 MG tablet Take 1 tablet (5 mg total) by mouth 3 (three) times daily as needed for muscle spasms. (Patient not taking: Reported on 10/09/2023) 30 tablet 1   naproxen (NAPROSYN) 500 MG tablet Take 1 tablet (500 mg total) by  mouth 2 (two) times daily with a meal. 14 tablet 0   No facility-administered medications prior to visit.   No Known Allergies   Objective:   Today's Vitals   10/09/23 1101  BP: 130/84  Pulse: 98  Temp: 98.4 F (36.9 C)  TempSrc: Temporal  SpO2: 98%  Weight: (!) 346 lb 6.4 oz (157.1 kg)  Height: 6\' 1"  (1.854 m)   Body mass index is 45.7 kg/m.   General: Well developed, well nourished. No acute distress. Back: Straight. Pain indicated over the left parathoracic spine area. Psych: Alert and oriented. Normal mood and affect.  There are no preventive care reminders to display for this patient.    Assessment & Plan:   Problem List Items Addressed This Visit       Other   Status post orchiectomy- Left   Other Visit Diagnoses       Infertility counseling    -  Primary   Discussed approaches to increasing fertility. I do recommend he see a urologist to have a semen analysis, esp. in light of prior surgery.   Relevant Orders   Ambulatory referral to Urology     Muscle spasm of back       Recommend heat, stretches, and approaches to relieve muscle spasm. May use OTC Tylenol or ibuprofen as needed.       Return for as needed.   Loyola Mast, MD

## 2024-02-17 ENCOUNTER — Telehealth: Admitting: Physician Assistant

## 2024-02-17 DIAGNOSIS — R300 Dysuria: Secondary | ICD-10-CM

## 2024-02-18 NOTE — Progress Notes (Signed)
 E-Visit for Urinary Problems  Based on what you shared with me, I feel your condition warrants further evaluation and I recommend that you be seen for a face to face office visit.  Male bladder infections are not very common.  We worry about prostate or kidney conditions.  The standard of care is to examine the abdomen and kidneys, and to do a urine and blood test to make sure that something more serious is not going on.  We recommend that you see a provider today.  If your doctor's office is closed [place] [facility] has the following Urgent Cares:   NOTE: There will be NO CHARGE for this E-Visit   If you are having a true medical emergency, please call 911.     For an urgent face to face visit, [facility] has multiple urgent care centers for your convenience.  Click the link below for the full list of locations and hours, walk-in wait times, appointment scheduling options and driving directions:  Urgent Care - [place], [place], [place], [place], [place], [place]       Your MyChart E-visit questionnaire answers were reviewed by a board certified advanced clinical practitioner to complete your personal care plan based on your specific symptoms.  Thank you for using e-Visits.

## 2024-02-23 ENCOUNTER — Encounter: Payer: Self-pay | Admitting: Family Medicine

## 2024-02-23 ENCOUNTER — Ambulatory Visit (INDEPENDENT_AMBULATORY_CARE_PROVIDER_SITE_OTHER): Admitting: Family Medicine

## 2024-02-23 VITALS — BP 130/72 | HR 92 | Temp 97.5°F | Ht 73.0 in | Wt 342.0 lb

## 2024-02-23 DIAGNOSIS — G8929 Other chronic pain: Secondary | ICD-10-CM | POA: Diagnosis not present

## 2024-02-23 DIAGNOSIS — L740 Miliaria rubra: Secondary | ICD-10-CM

## 2024-02-23 DIAGNOSIS — M5442 Lumbago with sciatica, left side: Secondary | ICD-10-CM | POA: Diagnosis not present

## 2024-02-23 DIAGNOSIS — N4889 Other specified disorders of penis: Secondary | ICD-10-CM | POA: Diagnosis not present

## 2024-02-23 DIAGNOSIS — M545 Low back pain, unspecified: Secondary | ICD-10-CM

## 2024-02-23 MED ORDER — CYCLOBENZAPRINE HCL 5 MG PO TABS
5.0000 mg | ORAL_TABLET | Freq: Three times a day (TID) | ORAL | 1 refills | Status: AC | PRN
Start: 2024-02-23 — End: ?

## 2024-02-23 MED ORDER — TRIAMCINOLONE ACETONIDE 0.1 % EX CREA: 1.0000 | TOPICAL_CREAM | Freq: Two times a day (BID) | CUTANEOUS | 0 refills | Status: AC

## 2024-02-23 NOTE — Assessment & Plan Note (Signed)
 I will provide Micheal Gross with home exercises he can do to reduce back discomfort and strengthen his core muscles. I also recommend he take microbreaks during the day, getting up out of his work chair every hour.

## 2024-02-23 NOTE — Patient Instructions (Signed)

## 2024-02-23 NOTE — Progress Notes (Signed)
 [facility] [address] [place] [address] Dept: [phone] Dept Fax: [phone]  Office Visit  Subjective:    Patient ID: Micheal Gross, male    DOB: May 03, 1991, 33 y.o..   MRN: [id number]  Chief Complaint  Patient presents with   Rash    C/o havng a rash/bumps on bottom and some burning on penis (lotion)?   History of Present Illness:  Patient is in today complaining of some bumps on his buttocks for the past several months. More recently these have been more irritated. He has tried using an antibacterial soap, hydrocortisone  cream, and Aquaphor lotion. He has not seen any drainage from this area.  Recently, he had used some cocoa butter on his penis. He thinks he may have gotten some into the meatus. This caused some burning. He used some Tylenol  and this has improved.   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 He has noted some intermittent low back pain. He asks about exercises he could do to reduce back discomfort.  Past Medical History: Patient Active Problem List   Diagnosis Date Noted   Status post orchiectomy- Left 10/09/2023   Left inguinal pain 05/13/2023   Prediabetes 09/26/2021   Borderline hyperlipidemia 09/26/2021   Allergic rhinitis 09/11/2021   Nasal polyp 09/11/2021   Low back pain 09/11/2021   Past Surgical History:  Procedure Laterality Date   ORCHIOPEXY Bilateral 01/03/2021   Procedure: SCROTAL EXPLORATION, RIGHT ORCHIOPEXY , LEFT ORCHIECTOMY;  Surgeon: Renda Glance, MD;  Location: [facility] OR;  Service: Urology;  Laterality: Bilateral;   Family History  Problem Relation Age of Onset   Hypertension Mother    Dementia Maternal Grandmother    Outpatient Medications Prior to Visit  Medication Sig Dispense Refill   fluticasone  (FLONASE ) 50 MCG/ACT nasal spray Place 2 sprays into both nostrils daily. 15.8 mL 6   cyclobenzaprine  (FLEXERIL ) 5 MG tablet Take 1 tablet (5 mg  total) by mouth 3 (three) times daily as needed for muscle spasms. (Patient not taking: Reported on 10/09/2023) 30 tablet 1   No facility-administered medications prior to visit.   No Known Allergies   Objective:   Today's Vitals   02/23/24 1604  BP: 130/72  Pulse: 92  Temp: (!) 97.5 F (36.4 C)  TempSrc: Temporal  SpO2: 96%  Weight: (!) 342 lb (155.1 kg)  Height: 6' 1 (1.854 m)   Body mass index is 45.12 kg/m.   General: Well developed, well nourished. No acute distress. Buttocks: There is an area of mild redness and papular rash along the right aspect of the gluteal cleft,   above the level of the rectum. These are not pustules and there is no drainage noted. GU: Normal circumcised male. The glans appears normal and no sign of discharge. There is mild redness   of the mucosa just inside the meatus. No discharge noted. Psych: Alert and oriented. Normal mood and affect.  Health Maintenance Due  Topic Date Due   Hepatitis B Vaccines (1 of 3 - 19+ 3-dose series) Never done   HPV VACCINES (1 - 3-dose SCDM series) Never done     Assessment & Plan:   Problem List Items Addressed This Visit       Other   Low back pain   I will provide Micheal Gross with home exercises he can do to reduce back discomfort and strengthen his core muscles. I also recommend he take microbreaks during the day, getting up out  of his work chair every hour.      Relevant Medications   cyclobenzaprine  (FLEXERIL ) 5 MG tablet   Other Visit Diagnoses       Heat rash- Buttocks    -  Primary   I will try him on TAC cream twice daily.   Relevant Medications   triamcinolone  cream (KENALOG ) 0.1 %     Pain in penis       Likely irritation due to the cocoa butter. This shoudl resolve with expectant management.       Return if symptoms worsen or fail to improve.   Garnette CHRISTELLA Simpler, MD

## 2024-04-20 NOTE — Telephone Encounter (Signed)
 Pt called in yesterday to say he had not gotten ct scan done and did he need to get it done. I addressed this w/Dr. Ethyl, whom stated that if patient wants polyps removed, we have to have CT scan done. Pt stated he need another antibiotic also. Pt will call back to reschedule appointment from this Thurs. To date after he gets CT scan done and I did send in antibiotic Rx to his pharmacy, 10 mg prednisone  21 count for dose count down from 6 to 1 tablets each day starting with 6 tabs on day 1, 5 on day 2, 4 on day 3,3 on day 4, 2 on day 5, and 1 on day 6. No refills.Sent to Pharmacy on file.

## 2024-06-10 ENCOUNTER — Encounter: Payer: Self-pay | Admitting: Family Medicine
